# Patient Record
Sex: Female | Born: 1970
Health system: Southern US, Community
[De-identification: ages and names within clinical notes are randomized; demographics above are authoritative.]

## PROBLEM LIST (undated history)

## (undated) DIAGNOSIS — K219 Gastro-esophageal reflux disease without esophagitis: Secondary | ICD-10-CM

## (undated) DIAGNOSIS — A5901 Trichomonal vulvovaginitis: Secondary | ICD-10-CM

## (undated) DIAGNOSIS — E785 Hyperlipidemia, unspecified: Secondary | ICD-10-CM

## (undated) DIAGNOSIS — F32A Depression, unspecified: Secondary | ICD-10-CM

## (undated) DIAGNOSIS — G43909 Migraine, unspecified, not intractable, without status migrainosus: Secondary | ICD-10-CM

## (undated) DIAGNOSIS — F329 Major depressive disorder, single episode, unspecified: Secondary | ICD-10-CM

## (undated) DIAGNOSIS — M1990 Unspecified osteoarthritis, unspecified site: Secondary | ICD-10-CM

## (undated) DIAGNOSIS — F419 Anxiety disorder, unspecified: Secondary | ICD-10-CM

## (undated) HISTORY — DX: Migraine, unspecified, not intractable, without status migrainosus: G43.909

## (undated) HISTORY — DX: Hyperlipidemia, unspecified: E78.5

## (undated) HISTORY — DX: Trichomonal vulvovaginitis: A59.01

## (undated) HISTORY — DX: Depression, unspecified: F32.A

## (undated) HISTORY — PX: CLEFT PALATE REPAIR: SUR1165

## (undated) HISTORY — DX: Major depressive disorder, single episode, unspecified: F32.9

## (undated) HISTORY — PX: TUBAL LIGATION: SHX77

## (undated) HISTORY — PX: TYMPANIC MEMBRANE REPAIR: SHX294

## (undated) HISTORY — DX: Anxiety disorder, unspecified: F41.9

---

## 2002-10-06 ENCOUNTER — Encounter: Payer: Self-pay | Admitting: Orthopedic Surgery

## 2002-10-06 ENCOUNTER — Encounter: Admission: RE | Admit: 2002-10-06 | Discharge: 2002-10-06 | Payer: Self-pay | Admitting: Orthopedic Surgery

## 2002-12-08 ENCOUNTER — Encounter: Payer: Self-pay | Admitting: Orthopedic Surgery

## 2002-12-08 ENCOUNTER — Ambulatory Visit (HOSPITAL_COMMUNITY): Admission: RE | Admit: 2002-12-08 | Discharge: 2002-12-08 | Payer: Self-pay | Admitting: Orthopedic Surgery

## 2003-02-14 ENCOUNTER — Encounter: Admission: RE | Admit: 2003-02-14 | Discharge: 2003-04-13 | Payer: Self-pay | Admitting: Orthopedic Surgery

## 2005-04-12 ENCOUNTER — Encounter: Admission: RE | Admit: 2005-04-12 | Discharge: 2005-04-12 | Payer: Self-pay | Admitting: Family Medicine

## 2006-09-29 ENCOUNTER — Encounter: Admission: RE | Admit: 2006-09-29 | Discharge: 2006-09-29 | Payer: Self-pay | Admitting: Family Medicine

## 2010-04-08 ENCOUNTER — Encounter: Payer: Self-pay | Admitting: Family Medicine

## 2010-07-12 ENCOUNTER — Other Ambulatory Visit (HOSPITAL_COMMUNITY): Payer: Self-pay | Admitting: Family Medicine

## 2010-07-12 DIAGNOSIS — Z139 Encounter for screening, unspecified: Secondary | ICD-10-CM

## 2010-07-19 ENCOUNTER — Ambulatory Visit (HOSPITAL_COMMUNITY): Payer: Self-pay

## 2010-07-26 ENCOUNTER — Ambulatory Visit (HOSPITAL_COMMUNITY)
Admission: RE | Admit: 2010-07-26 | Discharge: 2010-07-26 | Disposition: A | Discharge status: Home / Self Care | Payer: Medicaid Other | Source: Ambulatory Visit | Attending: Family Medicine | Admitting: Family Medicine

## 2010-07-26 DIAGNOSIS — Z1231 Encounter for screening mammogram for malignant neoplasm of breast: Secondary | ICD-10-CM | POA: Insufficient documentation

## 2010-07-26 DIAGNOSIS — Z139 Encounter for screening, unspecified: Secondary | ICD-10-CM

## 2010-08-03 NOTE — Op Note (Signed)
NAME:  Linda, Pugh                          ACCOUNT NO.:  [id number]   MEDICAL RECORD NO.:  [id number]                   PATIENT TYPE:  OIB   LOCATION:  2866                                 FACILITY:  [facility]   PHYSICIAN:  Ollen Gross, M.D.                 DATE OF BIRTH:  11-14-1970   DATE OF PROCEDURE:  12/08/2002  DATE OF DISCHARGE:                                 OPERATIVE REPORT   PREOPERATIVE DIAGNOSIS:  Right hip labral tear and chondral defect.   POSTOPERATIVE DIAGNOSIS:  Right hip labral tear and chondral defect.   OPERATION PERFORMED:  Right hip arthroscopy with debridement.   SURGEON:  Ollen Gross, M.D.   ASSISTANT:  Alexzandrew L. Julien Girt, P.A.   ANESTHESIA:  General.   ESTIMATED BLOOD LOSS:  Minimal.   COMPLICATIONS:  None.   CONDITION ON DISCHARGE:  Stable to recovery.   INDICATIONS FOR PROCEDURE:  Linda Pugh is a 40 year old female who had an on-the-  job injury about nine months ago when she developed significant immediate  hip pain.  The pain has gotten progressively worse over time.  Exam and  history are consistent with labral tear.  She does have arthroscopy and  debridement.   DESCRIPTION OF PROCEDURE:  After successful administration of general  anesthetic, the patient was placed in the left lateral decubitus position  with the right side up and the lateral perineal post was placed.  The post  was well padded.  Her right lower extremity was draped over the post and the  foot well-padded and placed in a traction boot. Under fluoroscopic guidance,  we then applied traction across the hip joint until it was adequately  distracted.  The hip was then prepped and draped in the usual sterile  fashion.  The standard anterior and posterior peritrochanteric portal sites  were marked and a spinal needle was passed and shown to enter the joint  fluoroscopically.  The fluoroscopy was then removed.  I injected 30mL of  saline into the joint through the posterior  spinal needle and it came out  through the anterior needle confirming they were both intra-articular.  We  then passed the nitinol wires and removed the needles.  The posterior  incision was made and the camera passed through the joint.  The arthroscopic  visualization proceeds.   The fovea looks normal.  The posterior and superior quadrants of the  acetabulum looked normal.  There was no evidence of labral tear or chondral  defect.  Anteriorly, she has an anterior-inferior labral tear as well as an  area that appears to be chondral blistering consistent with a probable  chondral delamination.  The femoral head looks normal throughout.  I then  made the incision for the anterior portal, dilated and passed a cannula and  probe.  She indeed had chondral delamination anterior superiorly.  There was  also an  anterior inferior labral  tear.  I debrided the labral tear with the  shaver and ArthroCare device.  The chondral delamination is about 1 cm x 5  mm.  I debrided that down to a stable bony base with the shaver with stable  cartilaginous edges.  We probed it and everything was found to be stable.  The labrum was not detached at all.  I again inspected the rest of the joint  and everything looked fine.  We then removed the arthroscopic equipment from  the anterior portal and injected 20mL of 0.5% Marcaine with epinephrine  through the posterior portal, removed the cannula and let traction off the  hip.  I then took a fluoroscopic spot and showed that the hip was  concentrically reduced.  We then closed the incisions with interrupted 4-0  nylon and put a bulky sterile dressing on.  She was then awakened and  transported to recovery in stable condition.                                                Ollen Gross, M.D.    FA/MEDQ  D:  12/08/2002  T:  12/09/2002  Job:  [id number]

## 2011-01-31 ENCOUNTER — Other Ambulatory Visit: Payer: Self-pay | Admitting: Orthopedic Surgery

## 2011-03-05 ENCOUNTER — Encounter (HOSPITAL_COMMUNITY): Payer: Self-pay | Admitting: Pharmacy Technician

## 2011-03-18 ENCOUNTER — Other Ambulatory Visit: Payer: Self-pay

## 2011-03-18 ENCOUNTER — Encounter (HOSPITAL_COMMUNITY)
Admission: RE | Admit: 2011-03-18 | Discharge: 2011-03-18 | Disposition: A | Discharge status: Home / Self Care | Payer: Medicaid Other | Source: Ambulatory Visit | Attending: Orthopedic Surgery | Admitting: Orthopedic Surgery

## 2011-03-18 ENCOUNTER — Encounter (HOSPITAL_COMMUNITY): Payer: Self-pay

## 2011-03-18 ENCOUNTER — Ambulatory Visit (HOSPITAL_COMMUNITY)
Admission: RE | Admit: 2011-03-18 | Discharge: 2011-03-18 | Disposition: A | Discharge status: Home / Self Care | Payer: Medicaid Other | Source: Ambulatory Visit | Attending: Orthopedic Surgery | Admitting: Orthopedic Surgery

## 2011-03-18 DIAGNOSIS — Z01812 Encounter for preprocedural laboratory examination: Secondary | ICD-10-CM | POA: Insufficient documentation

## 2011-03-18 DIAGNOSIS — Z01818 Encounter for other preprocedural examination: Secondary | ICD-10-CM | POA: Insufficient documentation

## 2011-03-18 HISTORY — DX: Unspecified osteoarthritis, unspecified site: M19.90

## 2011-03-18 HISTORY — DX: Gastro-esophageal reflux disease without esophagitis: K21.9

## 2011-03-18 LAB — CBC
HCT: 44.7 % (ref 36.0–46.0)
MCH: 32.4 pg (ref 26.0–34.0)
MCHC: 34.2 g/dL (ref 30.0–36.0)
MCV: 94.7 fL (ref 78.0–100.0)
RDW: 12.2 % (ref 11.5–15.5)

## 2011-03-18 LAB — BASIC METABOLIC PANEL
BUN: 12 mg/dL (ref 6–23)
CO2: 27 mEq/L (ref 19–32)
Chloride: 105 mEq/L (ref 96–112)
Creatinine, Ser: 0.78 mg/dL (ref 0.50–1.10)
Glucose, Bld: 79 mg/dL (ref 70–99)

## 2011-03-18 NOTE — Patient Instructions (Signed)
20 Linda Pugh  03/18/2011   Your procedure is scheduled on:  03/20/11 400pm-530 pm   Report to [facility] at 100 pm per Dr Lequita Halt   Call this number if you have problems the morning of surgery: [phone]   Remember:   Do not eat food:After Midnight.  May have clear liquids:until 0900 am then npo .  Clear liquids include soda, tea, black coffee, apple or grape juice, broth.  Take these medicines the morning of surgery with A SIP OF WATER:    Do not wear jewelry, make-up or nail polish.   Do not wear lotions, powders, or perfumes.   Do not shave 48 hours prior to surgery.  Do not bring valuables to the hospital.  Contacts, dentures or bridgework may not be worn into surgery.     Patients discharged the day of surgery will not be allowed to drive home.  Name and phone number of your driver:   Special Instructions: CHG Shower Use Special Wash: 1/2 bottle night before surgery and 1/2 bottle morning of surgery. shower chin to toes with CHG. Wash face and private parts with regular soap.    Please read over the following fact sheets that you were given: MRSA Information, incentive spirometry , coughing and deep breathing exercises and leg exercises

## 2011-03-19 ENCOUNTER — Encounter (HOSPITAL_COMMUNITY): Payer: Self-pay | Admitting: Orthopedic Surgery

## 2011-03-19 NOTE — H&P (Signed)
  CC- Linda Pugh is a 41 y.o. female who presents with right hip pain  Hip Pain: Patient complains of right hip pain. Onset of the symptoms was several months ago. Inciting event: none. Current symptoms include pain which  is worse with weight bearing, is aggravated by walking and any hip rotation. Associated symptoms, low back pain. Aggravating symptoms: any weight bearing, pivoting, rising after sitting and squatting. Patient's course of pain: gradually worsening. Patient has had prior hip problems. Previous visits for this problem: had successful hip arthroscopy several years ago. Current symptoms are similar.. Evaluation to date: plain films, which were normal and MRI lumbar was normal.  Treatment to date: intra-articular injection provided mild short term relief..  Past Medical History  Diagnosis Date  . GERD (gastroesophageal reflux disease)   . Arthritis     arthritis right hip     Past Surgical History  Procedure Date  . Tubal ligation   . Other surgical history     cleftg palate surgery   . Other surgical history     ear surgery   . Other surgical history     right hip arthroscopy     Prior to Admission medications   Medication Sig Start Date End Date Taking? Authorizing Provider  naproxen sodium (ANAPROX) 220 MG tablet Take 440 mg by mouth 2 (two) times daily as needed. Pain     Historical Provider, MD    Physical Examination: General appearance - alert, well appearing, and in no distress Mental status - alert, oriented to person, place, and time Chest - clear to auscultation, no wheezes, rales or rhonchi, symmetric air entry Heart - normal rate, regular rhythm, normal S1, S2, no murmurs, rubs, clicks or gallops Abdomen - soft, nontender, nondistended, no masses or organomegaly Neurological - alert, oriented, normal speech, no focal findings or movement disorder noted  A right hip exam was performed. TENDERNESS: mild ROM: normal STRENGTH: normal Pain with  provacative testing  ASSESSMENT: Right hip pain- Exam and history consistent with recurrent labral tear and possible chondral defect. Presents for hip arthroscopy and debridement. Discussed in detail with patient who elects to proceed. Goals are decreased pain and improved function with high likelihood of achieving both.  Plan  Linda Pugh. Willo Yoon, MD    03/19/2011, 1:54 PM

## 2011-03-20 ENCOUNTER — Encounter (HOSPITAL_COMMUNITY): Payer: Self-pay | Admitting: *Deleted

## 2011-03-20 ENCOUNTER — Encounter (HOSPITAL_COMMUNITY): Payer: Self-pay | Admitting: Anesthesiology

## 2011-03-20 ENCOUNTER — Ambulatory Visit (HOSPITAL_COMMUNITY): Payer: Medicaid Other | Admitting: Anesthesiology

## 2011-03-20 ENCOUNTER — Ambulatory Visit (HOSPITAL_COMMUNITY)
Admission: RE | Admit: 2011-03-20 | Discharge: 2011-03-20 | Disposition: A | Discharge status: Home / Self Care | Payer: Medicaid Other | Source: Ambulatory Visit | Attending: Orthopedic Surgery | Admitting: Orthopedic Surgery

## 2011-03-20 ENCOUNTER — Encounter (HOSPITAL_COMMUNITY): Payer: Self-pay | Admitting: Orthopedic Surgery

## 2011-03-20 ENCOUNTER — Ambulatory Visit (HOSPITAL_COMMUNITY): Payer: Medicaid Other

## 2011-03-20 ENCOUNTER — Encounter (HOSPITAL_COMMUNITY)
Admission: RE | Disposition: A | Discharge status: Home / Self Care | Payer: Self-pay | Source: Ambulatory Visit | Attending: Orthopedic Surgery

## 2011-03-20 DIAGNOSIS — Z01810 Encounter for preprocedural cardiovascular examination: Secondary | ICD-10-CM | POA: Insufficient documentation

## 2011-03-20 DIAGNOSIS — K219 Gastro-esophageal reflux disease without esophagitis: Secondary | ICD-10-CM | POA: Insufficient documentation

## 2011-03-20 DIAGNOSIS — M24159 Other articular cartilage disorders, unspecified hip: Secondary | ICD-10-CM | POA: Insufficient documentation

## 2011-03-20 DIAGNOSIS — M1610 Unilateral primary osteoarthritis, unspecified hip: Secondary | ICD-10-CM | POA: Insufficient documentation

## 2011-03-20 DIAGNOSIS — M25551 Pain in right hip: Secondary | ICD-10-CM

## 2011-03-20 HISTORY — PX: HIP ARTHROSCOPY: SHX668

## 2011-03-20 SURGERY — ARTHROSCOPY HIP
Anesthesia: General | Site: Hip | Laterality: Right | Wound class: Clean

## 2011-03-20 MED ORDER — CHLORHEXIDINE GLUCONATE 4 % EX LIQD: 60.0000 mL | Freq: Once | CUTANEOUS | Status: DC

## 2011-03-20 MED ORDER — ACETAMINOPHEN 10 MG/ML IV SOLN
1000.0000 mg | Freq: Once | INTRAVENOUS | Status: DC
  Filled 2011-03-20: qty 100

## 2011-03-20 MED ORDER — OXYCODONE HCL 5 MG PO TABS
ORAL_TABLET | ORAL | Status: AC
  Administered 2011-03-20: 5 mg via ORAL
  Filled 2011-03-20: qty 1

## 2011-03-20 MED ORDER — LACTATED RINGERS IV SOLN
INTRAVENOUS | Status: DC
  Administered 2011-03-20 (×2): via INTRAVENOUS
  Administered 2011-03-20: 1000 mL via INTRAVENOUS

## 2011-03-20 MED ORDER — DEXAMETHASONE SODIUM PHOSPHATE 4 MG/ML IJ SOLN
INTRAMUSCULAR | Status: DC | PRN
  Administered 2011-03-20: 5 mg via INTRAVENOUS

## 2011-03-20 MED ORDER — OXYCODONE HCL 5 MG PO TABS: 5.0000 mg | ORAL_TABLET | ORAL | Status: AC | PRN

## 2011-03-20 MED ORDER — KETOROLAC TROMETHAMINE 30 MG/ML IJ SOLN
INTRAMUSCULAR | Status: DC | PRN
  Administered 2011-03-20: 30 mg via INTRAVENOUS

## 2011-03-20 MED ORDER — ACETAMINOPHEN 10 MG/ML IV SOLN
INTRAVENOUS | Status: DC | PRN
  Administered 2011-03-20: 1000 mg via INTRAVENOUS

## 2011-03-20 MED ORDER — SODIUM CHLORIDE 0.9 % IJ SOLN
INTRAMUSCULAR | Status: DC | PRN
  Administered 2011-03-20: 50 mL

## 2011-03-20 MED ORDER — GLYCOPYRROLATE 0.2 MG/ML IJ SOLN
INTRAMUSCULAR | Status: DC | PRN
  Administered 2011-03-20: .4 mg via INTRAVENOUS

## 2011-03-20 MED ORDER — PROPOFOL 10 MG/ML IV EMUL
INTRAVENOUS | Status: DC | PRN
  Administered 2011-03-20: 100 mg via INTRAVENOUS

## 2011-03-20 MED ORDER — OXYCODONE HCL 5 MG PO TABS
5.0000 mg | ORAL_TABLET | ORAL | Status: DC | PRN
  Administered 2011-03-20: 5 mg via ORAL

## 2011-03-20 MED ORDER — LACTATED RINGERS IV SOLN: INTRAVENOUS | Status: DC

## 2011-03-20 MED ORDER — SODIUM CHLORIDE 0.9 % IV SOLN: INTRAVENOUS | Status: DC

## 2011-03-20 MED ORDER — NEOSTIGMINE METHYLSULFATE 1 MG/ML IJ SOLN
INTRAMUSCULAR | Status: DC | PRN
  Administered 2011-03-20: 2 mg via INTRAVENOUS

## 2011-03-20 MED ORDER — FENTANYL CITRATE 0.05 MG/ML IJ SOLN
INTRAMUSCULAR | Status: DC | PRN
  Administered 2011-03-20: 100 ug via INTRAVENOUS

## 2011-03-20 MED ORDER — CEFAZOLIN SODIUM 1-5 GM-% IV SOLN
1.0000 g | INTRAVENOUS | Status: AC
  Administered 2011-03-20: 1 g via INTRAVENOUS
  Filled 2011-03-20: qty 50

## 2011-03-20 MED ORDER — CISATRACURIUM BESYLATE 2 MG/ML IV SOLN
INTRAVENOUS | Status: DC | PRN
  Administered 2011-03-20: 2 mg via INTRAVENOUS

## 2011-03-20 MED ORDER — PROMETHAZINE HCL 25 MG/ML IJ SOLN: 6.2500 mg | INTRAMUSCULAR | Status: DC | PRN

## 2011-03-20 MED ORDER — ONDANSETRON HCL 4 MG/2ML IJ SOLN
INTRAMUSCULAR | Status: DC | PRN
  Administered 2011-03-20: 4 mg via INTRAVENOUS

## 2011-03-20 MED ORDER — LIDOCAINE HCL (CARDIAC) 20 MG/ML IV SOLN
INTRAVENOUS | Status: DC | PRN
  Administered 2011-03-20: 50 mg via INTRAVENOUS

## 2011-03-20 MED ORDER — BUPIVACAINE-EPINEPHRINE 0.25% -1:200000 IJ SOLN
INTRAMUSCULAR | Status: DC | PRN
  Administered 2011-03-20: 20 mL

## 2011-03-20 MED ORDER — MIDAZOLAM HCL 5 MG/5ML IJ SOLN
INTRAMUSCULAR | Status: DC | PRN
  Administered 2011-03-20: 1 mg via INTRAVENOUS

## 2011-03-20 MED ORDER — BUPIVACAINE-EPINEPHRINE PF 0.25-1:200000 % IJ SOLN
INTRAMUSCULAR | Status: AC
  Filled 2011-03-20: qty 30

## 2011-03-20 MED ORDER — FENTANYL CITRATE 0.05 MG/ML IJ SOLN
25.0000 ug | INTRAMUSCULAR | Status: DC | PRN
  Administered 2011-03-20: 25 ug via INTRAVENOUS

## 2011-03-20 MED ORDER — METHOCARBAMOL 500 MG PO TABS: 500.0000 mg | ORAL_TABLET | Freq: Four times a day (QID) | ORAL | Status: AC

## 2011-03-20 MED ORDER — BUPIVACAINE HCL (PF) 0.25 % IJ SOLN
INTRAMUSCULAR | Status: AC
  Filled 2011-03-20: qty 30

## 2011-03-20 MED ORDER — DEXAMETHASONE SODIUM PHOSPHATE 10 MG/ML IJ SOLN
10.0000 mg | Freq: Once | INTRAMUSCULAR | Status: DC
  Filled 2011-03-20: qty 1

## 2011-03-20 MED ORDER — LACTATED RINGERS IR SOLN
Status: DC | PRN
  Administered 2011-03-20: 3000 mL

## 2011-03-20 MED ORDER — FENTANYL CITRATE 0.05 MG/ML IJ SOLN
INTRAMUSCULAR | Status: AC
  Filled 2011-03-20: qty 2

## 2011-03-20 MED ORDER — SUCCINYLCHOLINE CHLORIDE 20 MG/ML IJ SOLN
INTRAMUSCULAR | Status: DC | PRN
  Administered 2011-03-20: 40 mg via INTRAVENOUS

## 2011-03-20 SURGICAL SUPPLY — 34 items
BAG DECANTER FOR FLEXI CONT (MISCELLANEOUS) ×1 IMPLANT
BLADE DA 4.2 (BLADE) ×2 IMPLANT
CLOTH BEACON ORANGE TIMEOUT ST (SAFETY) ×2 IMPLANT
COVER MAYO STAND STRL (DRAPES) ×2 IMPLANT
DRAPE LG THREE QUARTER DISP (DRAPES) ×2 IMPLANT
DRAPE STERI 35X30 U-POUCH (DRAPES) ×4 IMPLANT
DRAPE STERI IOBAN 125X83 (DRAPES) ×2 IMPLANT
DRSG EMULSION OIL 3X3 NADH (GAUZE/BANDAGES/DRESSINGS) ×2 IMPLANT
DRSG MEPILEX BORDER 4X4 (GAUZE/BANDAGES/DRESSINGS) ×1 IMPLANT
DRSG PAD ABDOMINAL 8X10 ST (GAUZE/BANDAGES/DRESSINGS) ×3 IMPLANT
DURAPREP 26ML APPLICATOR (WOUND CARE) ×2 IMPLANT
GLOVE BIO SURGEON STRL SZ7.5 (GLOVE) ×2 IMPLANT
GLOVE BIO SURGEON STRL SZ8 (GLOVE) ×2 IMPLANT
GLOVE BIOGEL PI IND STRL 8 (GLOVE) ×2 IMPLANT
GLOVE BIOGEL PI INDICATOR 8 (GLOVE) ×2
GOWN STRL NON-REIN LRG LVL3 (GOWN DISPOSABLE) ×2 IMPLANT
GOWN STRL REIN XL XLG (GOWN DISPOSABLE) ×2 IMPLANT
KIT HIP ARTHROSCOPY (SET/KITS/TRAYS/PACK) ×2 IMPLANT
MANIFOLD NEPTUNE II (INSTRUMENTS) ×4 IMPLANT
NEEDLE HYPO 22GX1.5 SAFETY (NEEDLE) ×1 IMPLANT
NS IRRIG 1000ML POUR BTL (IV SOLUTION) ×2 IMPLANT
PACK ARTHROSCOPY WL (CUSTOM PROCEDURE TRAY) ×2 IMPLANT
PAD CAST 4YDX4 CTTN HI CHSV (CAST SUPPLIES) ×1 IMPLANT
PADDING CAST COTTON 4X4 STRL (CAST SUPPLIES) ×2
POSITIONER SURGICAL ARM (MISCELLANEOUS) ×3 IMPLANT
SET ARTHROSCOPY TUBING (MISCELLANEOUS) ×2
SET ARTHROSCOPY TUBING LN (MISCELLANEOUS) ×1 IMPLANT
SPONGE GAUZE 4X4 12PLY (GAUZE/BANDAGES/DRESSINGS) ×2 IMPLANT
SUT ETHILON 4 0 PS 2 18 (SUTURE) ×2 IMPLANT
SYR 20CC LL (SYRINGE) ×1 IMPLANT
TAPE CLOTH 4X10 WHT NS (GAUZE/BANDAGES/DRESSINGS) ×2 IMPLANT
TOWEL OR 17X26 10 PK STRL BLUE (TOWEL DISPOSABLE) ×4 IMPLANT
WAND TURBOVAC 50 DEGREE (SURGICAL WAND) ×1 IMPLANT
WATER STERILE IRR 1500ML POUR (IV SOLUTION) ×1 IMPLANT

## 2011-03-20 NOTE — Op Note (Signed)
PREOPERATIVE DIAGNOSIS:   Right hip labral tear.   POSTOPERATIVE DIAGNOSIS:  1. Right hip labral tear.  2. Chondral defect.   PROCEDURE: Right hip arthroscopy with labral debridement and chondroplasty  SURGEON: Ollen Gross, M.D.   ASSISTANT: Alexzandrew L. Perkins, P.A.C.   ANESTHESIA: General.   ESTIMATED BLOOD LOSS: Minimal.   DRAINS: None.   COMPLICATION: None.   CONDITION: Stable to recovery.   BRIEF CLINICAL INDICATION: Linda Pugh is a 41 y.o. female with a long  history of significant pain and dysfunction of her right hip. Exam and  history suggested labral tear, recurrent in nature. She has had  extensive nonoperative management, presents now for arthroscopy and  debridement.   PROCEDURE IN DETAIL: After successful administration of general  anesthetic, the patient was placed in the Left lateral decubitus  position with the Right  side up. Lower leg was well padded. Right  lower extremity was then placed over the well-padded peroneal post laterally  and then placed in the well-padded foot holder. Under fluoroscopic  guidance, traction was applied across Right lower extremity until the hip  was adequately distracted. Traction was locked in this position. The  thigh was then prepped and draped in usual sterile fashion. Spinal  needles were passed through the sites marked, anterior and posterior  peritrochanteric portals. A small incision was made around the  posterior needle. Through a cannulated system, the cannula was passed  into the joint. Camera passed into the joint. Once confirmed to be  intra-articular, inflow was initiated.      The femoral head shows a mild chondromalacia, no full-thickness defects. The anterior aspect of the joint from about the 1 o'clock to the 5 o'clock position shows  Grade III chondromalacia along the chondral-labral junction with some  exposed bone. There was a tear in the labrum going from the one o'clock  to the 4 o'clock  position. It was not detached. Tear was on the free  edge of the labrum. The anterior port was then accessed and labral  tear debrided back to a stable base with the shaver and the ArthroCare  device. The unstable cartilage on the surface of the acetabulum was  debrided back to a stable bony base with stable cartilaginous edges.  The exposed bone was abraded with the shaver for an abrasion  chondroplasty. The overall size of this was about 1 x 2 cm. We then  Inspected the rest of the joint and no other tears, defects, or loose bodies were noted.       The working portal equipment was removed and then 20 cc of 0.25% Marcaine  with epinephrine injected through the inflow cannula and that was  removed. Traction was removed from the joint and the incision was  closed with interrupted 4-0 nylon. Incisions were cleaned and dried.  Bulky sterile dressing applied. She was then awakened and transported  to recovery in stable condition.   Ollen Gross, M.D.

## 2011-03-20 NOTE — Anesthesia Procedure Notes (Addendum)
Anesthesia Regional Block:   Narrative:    Procedure Name: Intubation Date/Time: 03/20/2011 3:19 PM Performed by: Lurlean Leyden, Ceira Hoeschen L. Patient Re-evaluated:Patient Re-evaluated prior to inductionOxygen Delivery Method: Circle System Utilized Preoxygenation: Pre-oxygenation with 100% oxygen Intubation Type: IV induction Ventilation: Mask ventilation without difficulty and Oral airway inserted - appropriate to patient size Laryngoscope Size: Miller and 2 Grade View: Grade I Tube type: Oral Tube size: 7.0 mm Number of attempts: 1 Airway Equipment and Method: stylet Placement Confirmation: ETT inserted through vocal cords under direct vision,  positive ETCO2 and breath sounds checked- equal and bilateral Secured at: 20 cm Tube secured with: Tape Dental Injury: Teeth and Oropharynx as per pre-operative assessment

## 2011-03-20 NOTE — Transfer of Care (Signed)
Immediate Anesthesia Transfer of Care Note  Patient: Linda Pugh  Procedure(s) Performed:  ARTHROSCOPY HIP - hip arthtroscopy right with labral debridment and chondroplasty  Patient Location: PACU  Anesthesia Type: General  Level of Consciousness: alert  and oriented  Airway & Oxygen Therapy: Patient Spontanous Breathing and Patient connected to face mask oxygen  Post-op Assessment: Report given to PACU RN and Post -op Vital signs reviewed and stable  Post vital signs: Reviewed and stable  Complications: No apparent anesthesia complications

## 2011-03-20 NOTE — Interval H&P Note (Signed)
History and Physical Interval Note:  03/20/2011 3:06 PM  Linda Pugh  has presented today for surgery, with the diagnosis of Right Hip Labral Tear  The various methods of treatment have been discussed with the patient and family. After consideration of risks, benefits and other options for treatment, the patient has consented to  Procedure(s): ARTHROSCOPY HIP as a surgical intervention .  The patients' history has been reviewed, patient examined, no change in status, stable for surgery.  I have reviewed the patients' chart and labs.  Questions were answered to the patient's satisfaction.     Loanne Drilling

## 2011-03-20 NOTE — Preoperative (Signed)
Beta Blockers   Reason not to administer Beta Blockers:Not Applicable 

## 2011-03-20 NOTE — Anesthesia Postprocedure Evaluation (Signed)
  Anesthesia Post-op Note  Patient: Linda Pugh  Procedure(s) Performed:  ARTHROSCOPY HIP - hip arthtroscopy right with labral debridment and chondroplasty  Patient Location: PACU  Anesthesia Type: General  Level of Consciousness: awake and alert   Airway and Oxygen Therapy: Patient Spontanous Breathing  Post-op Pain: mild  Post-op Assessment: Post-op Vital signs reviewed, Patient's Cardiovascular Status Stable, Respiratory Function Stable, Patent Airway and No signs of Nausea or vomiting  Post-op Vital Signs: stable  Complications: No apparent anesthesia complications

## 2011-03-20 NOTE — Progress Notes (Signed)
Call To Dr Lequita Halt re: prn pain med in Postop and crutches

## 2011-03-20 NOTE — Anesthesia Preprocedure Evaluation (Addendum)
Anesthesia Evaluation  Patient identified by MRN, date of birth, ID band Patient awake    Reviewed: Allergy & Precautions, H&P , NPO status , Patient's Chart, lab work & pertinent test results  Airway Mallampati: II TM Distance: >3 FB Neck ROM: full    Dental No notable dental hx. (+) Teeth Intact and Dental Advisory Given   Pulmonary Current Smoker,  clear to auscultation  Pulmonary exam normal       Cardiovascular Exercise Tolerance: Good neg cardio ROS regular Normal    Neuro/Psych Negative Neurological ROS  Negative Psych ROS   GI/Hepatic negative GI ROS, Neg liver ROS, GERD-  Controlled,  Endo/Other  Negative Endocrine ROS  Renal/GU negative Renal ROS  Genitourinary negative   Musculoskeletal   Abdominal   Peds  Hematology negative hematology ROS (+)   Anesthesia Other Findings   Reproductive/Obstetrics negative OB ROS                         Anesthesia Physical Anesthesia Plan  ASA: II  Anesthesia Plan: General   Post-op Pain Management:    Induction: Intravenous  Airway Management Planned: Oral ETT  Additional Equipment:   Intra-op Plan:   Post-operative Plan: Extubation in OR  Informed Consent: I have reviewed the patients History and Physical, chart, labs and discussed the procedure including the risks, benefits and alternatives for the proposed anesthesia with the patient or authorized representative who has indicated his/her understanding and acceptance.   Dental Advisory Given  Plan Discussed with: CRNA and Surgeon  Anesthesia Plan Comments:        Anesthesia Quick Evaluation

## 2011-03-21 ENCOUNTER — Encounter (HOSPITAL_COMMUNITY): Payer: Self-pay | Admitting: Orthopedic Surgery

## 2011-05-20 ENCOUNTER — Encounter (HOSPITAL_COMMUNITY): Payer: Self-pay | Admitting: Emergency Medicine

## 2011-05-20 ENCOUNTER — Emergency Department (HOSPITAL_COMMUNITY)
Admission: EM | Admit: 2011-05-20 | Discharge: 2011-05-20 | Disposition: A | Discharge status: Home / Self Care | Payer: Medicaid Other | Attending: Emergency Medicine | Admitting: Emergency Medicine

## 2011-05-20 ENCOUNTER — Emergency Department (HOSPITAL_COMMUNITY): Payer: Medicaid Other

## 2011-05-20 DIAGNOSIS — K219 Gastro-esophageal reflux disease without esophagitis: Secondary | ICD-10-CM | POA: Insufficient documentation

## 2011-05-20 DIAGNOSIS — R071 Chest pain on breathing: Secondary | ICD-10-CM | POA: Insufficient documentation

## 2011-05-20 DIAGNOSIS — R0789 Other chest pain: Secondary | ICD-10-CM

## 2011-05-20 DIAGNOSIS — M1610 Unilateral primary osteoarthritis, unspecified hip: Secondary | ICD-10-CM | POA: Insufficient documentation

## 2011-05-20 DIAGNOSIS — F17200 Nicotine dependence, unspecified, uncomplicated: Secondary | ICD-10-CM | POA: Insufficient documentation

## 2011-05-20 MED ORDER — HYDROCODONE-ACETAMINOPHEN 5-325 MG PO TABS: 1.0000 | ORAL_TABLET | ORAL | Status: AC | PRN

## 2011-05-20 NOTE — ED Notes (Signed)
Pt c/o left sided chest pain since her son hugged her.

## 2011-05-20 NOTE — Discharge Instructions (Signed)
Chest Wall Pain Chest wall pain is pain in or around the bones and muscles of your chest. It may take up to 6 weeks to get better. It may take longer if you must stay physically active in your work and activities.  CAUSES  Chest wall pain may happen on its own. However, it may be caused by:  A viral illness like the flu.   Injury.   Coughing.   Exercise.   Arthritis.   Fibromyalgia.   Shingles.  HOME CARE INSTRUCTIONS   Avoid overtiring physical activity. Try not to strain or perform activities that cause pain. This includes any activities using your chest or your abdominal and side muscles, especially if heavy weights are used.   Put ice on the sore area.   Put ice in a plastic bag.   Place a towel between your skin and the bag.   Leave the ice on for 15 to 20 minutes per hour while awake for the first 2 days.   Only take over-the-counter or prescription medicines for pain, discomfort, or fever as directed by your caregiver.  SEEK IMMEDIATE MEDICAL CARE IF:   Your pain increases, or you are very uncomfortable.   You have a fever.   Your chest pain becomes worse.   You have new, unexplained symptoms.   You have nausea or vomiting.   You feel sweaty or lightheaded.   You have a cough with phlegm (sputum), or you cough up blood.  MAKE SURE YOU:   Understand these instructions.   Will watch your condition.   Will get help right away if you are not doing well or get worse.  Document Released: 03/04/2005 Document Revised: 02/21/2011 Document Reviewed: 10/29/2010 [facility] Patient Information 2012 [place], [place].   You may take the hydrocodone prescribed for pain relief.  This will make you drowsy - do not drive within 4 hours of taking this medication.

## 2011-05-20 NOTE — ED Provider Notes (Signed)
History     CSN: [id number]  Arrival date & time 05/20/11  1704   First MD Initiated Contact with Patient 05/20/11 1731      Chief Complaint  Patient presents with  . rib pain     (Consider location/radiation/quality/duration/timing/severity/associated sxs/prior treatment) HPI Comments: Patient presents for evaluation of the left chest wall pain which started immediately after she was "bearhugged" by her teenage son, 3 days ago.  Pain is worse with deep inspiration movement and palpation.  Patient is a 41 y.o. female presenting with chest pain. The history is provided by the patient.  Chest Pain The chest pain began 3 - 5 days ago. Chest pain occurs constantly. The chest pain is unchanged. The pain is associated with breathing, exertion and lifting. At its most intense, the pain is at 9/10. The pain is currently at 9/10. The quality of the pain is described as sharp. The pain does not radiate. Pertinent negatives for primary symptoms include no fever, no fatigue, no shortness of breath, no cough, no wheezing, no abdominal pain, no nausea, no vomiting and no dizziness.  Pertinent negatives for associated symptoms include no diaphoresis, no near-syncope, no numbness, no orthopnea, no paroxysmal nocturnal dyspnea and no weakness. She tried nothing for the symptoms. Risk factors include no known risk factors.     Past Medical History  Diagnosis Date  . GERD (gastroesophageal reflux disease)   . Arthritis     arthritis right hip     Past Surgical History  Procedure Date  . Tubal ligation   . Other surgical history     cleftg palate surgery   . Other surgical history     ear surgery   . Other surgical history     right hip arthroscopy   . Hip arthroscopy 03/20/2011    Procedure: ARTHROSCOPY HIP;  Surgeon: Gus Rankin Aluisio;  Location: [facility];  Service: Orthopedics;  Laterality: Right;  hip arthtroscopy right with labral debridment and chondroplasty    No family history on  file.  History  Substance Use Topics  . Smoking status: Current Everyday Smoker -- 1.0 packs/day for 23 years  . Smokeless tobacco: Never Used  . Alcohol Use: No    OB History    Grav Para Term Preterm Abortions TAB SAB Ect Mult Living                  Review of Systems  Constitutional: Negative for fever, diaphoresis and fatigue.  HENT: Negative for congestion, sore throat and neck pain.   Eyes: Negative.   Respiratory: Negative for cough, chest tightness, shortness of breath and wheezing.   Cardiovascular: Positive for chest pain. Negative for orthopnea and near-syncope.  Gastrointestinal: Negative for nausea, vomiting and abdominal pain.  Genitourinary: Negative.   Musculoskeletal: Negative for joint swelling and arthralgias.  Skin: Negative.  Negative for rash and wound.  Neurological: Negative for dizziness, weakness, light-headedness, numbness and headaches.  Hematological: Negative.   Psychiatric/Behavioral: Negative.     Allergies  Sulfa antibiotics  Home Medications   Current Outpatient Rx  Name Route Sig Dispense Refill  . HYDROCODONE-ACETAMINOPHEN 5-325 MG PO TABS Oral Take 1 tablet by mouth every 4 (four) hours as needed for pain. 15 tablet 0  . NAPROXEN SODIUM 220 MG PO TABS Oral Take 440 mg by mouth 2 (two) times daily as needed. Pain       BP 98/48  Pulse 91  Temp(Src) 97.7 F (36.5 C) (Oral)  Resp 18  Ht  5\' 2"  (1.575 m)  Wt 89 lb (40.37 kg)  BMI 16.28 kg/m2  SpO2 100%  LMP 05/13/2011  Physical Exam  Nursing note and vitals reviewed. Constitutional: She is oriented to person, place, and time. She appears well-developed and well-nourished.  HENT:  Head: Normocephalic and atraumatic.  Eyes: Conjunctivae are normal.  Neck: Normal range of motion.  Cardiovascular: Normal rate, regular rhythm, normal heart sounds and intact distal pulses.   Pulmonary/Chest: Effort normal and breath sounds normal. No respiratory distress. She has no wheezes. She  exhibits tenderness.    Abdominal: Soft. Bowel sounds are normal. There is no tenderness. There is no rebound and no guarding.  Musculoskeletal: Normal range of motion.  Neurological: She is alert and oriented to person, place, and time.  Skin: Skin is warm and dry.  Psychiatric: She has a normal mood and affect.    ED Course  Procedures (including critical care time)  Labs Reviewed - No data to display Dg Ribs Unilateral W/chest Left  05/20/2011  *RADIOLOGY REPORT*  Clinical Data: Left anterior rib pain.  LEFT RIBS AND CHEST - 3+ VIEW  Comparison: Chest x-ray from 03/18/2011.  Findings: The lungs are clear without focal infiltrate, edema, pneumothorax or pleural effusion. The cardiopericardial silhouette is within normal limits for size.  Oblique views of the left ribs showed no evidence for displaced left-sided rib fracture.  IMPRESSION: Normal chest x-ray.  No evidence for rib fracture.  Original Report Authenticated By: ERIC A. MANSELL, M.D.     1. Chest wall pain       MDM  Vital signs reviewed with blood pressure 98/48, prior blood pressures in patient's chart 96/45 and 110/61 in the past several months.  Patient is without orthostatic symptoms.        Candis Musa, PA 05/20/11 1908

## 2011-05-20 NOTE — ED Notes (Signed)
Pain lt  Lat ribs.  Injured  On Friday when hugged by son , and heard cracking sound.  Alert, NAD.

## 2011-05-21 NOTE — ED Provider Notes (Signed)
Medical screening examination/treatment/procedure(s) were performed by non-physician practitioner and as supervising physician I was immediately available for consultation/collaboration.   Geoffery Lyons, MD 05/21/11 [phone]

## 2012-10-26 ENCOUNTER — Emergency Department (HOSPITAL_COMMUNITY)
Admission: EM | Admit: 2012-10-26 | Discharge: 2012-10-26 | Disposition: A | Discharge status: Home / Self Care | Payer: Self-pay | Attending: Emergency Medicine | Admitting: Emergency Medicine

## 2012-10-26 ENCOUNTER — Emergency Department (HOSPITAL_COMMUNITY): Payer: Self-pay

## 2012-10-26 ENCOUNTER — Encounter (HOSPITAL_COMMUNITY): Payer: Self-pay | Admitting: *Deleted

## 2012-10-26 DIAGNOSIS — R110 Nausea: Secondary | ICD-10-CM | POA: Insufficient documentation

## 2012-10-26 DIAGNOSIS — N390 Urinary tract infection, site not specified: Secondary | ICD-10-CM | POA: Insufficient documentation

## 2012-10-26 DIAGNOSIS — M1610 Unilateral primary osteoarthritis, unspecified hip: Secondary | ICD-10-CM | POA: Insufficient documentation

## 2012-10-26 DIAGNOSIS — Z3202 Encounter for pregnancy test, result negative: Secondary | ICD-10-CM | POA: Insufficient documentation

## 2012-10-26 DIAGNOSIS — B9689 Other specified bacterial agents as the cause of diseases classified elsewhere: Secondary | ICD-10-CM | POA: Insufficient documentation

## 2012-10-26 DIAGNOSIS — R300 Dysuria: Secondary | ICD-10-CM | POA: Insufficient documentation

## 2012-10-26 DIAGNOSIS — Z8719 Personal history of other diseases of the digestive system: Secondary | ICD-10-CM | POA: Insufficient documentation

## 2012-10-26 DIAGNOSIS — R3915 Urgency of urination: Secondary | ICD-10-CM | POA: Insufficient documentation

## 2012-10-26 DIAGNOSIS — Z9851 Tubal ligation status: Secondary | ICD-10-CM | POA: Insufficient documentation

## 2012-10-26 DIAGNOSIS — F17200 Nicotine dependence, unspecified, uncomplicated: Secondary | ICD-10-CM | POA: Insufficient documentation

## 2012-10-26 DIAGNOSIS — A499 Bacterial infection, unspecified: Secondary | ICD-10-CM | POA: Insufficient documentation

## 2012-10-26 LAB — WET PREP, GENITAL
Clue Cells Wet Prep HPF POC: NONE SEEN
Trich, Wet Prep: NONE SEEN
WBC, Wet Prep HPF POC: NONE SEEN

## 2012-10-26 LAB — URINALYSIS, ROUTINE W REFLEX MICROSCOPIC
Bilirubin Urine: NEGATIVE
Glucose, UA: NEGATIVE mg/dL
Ketones, ur: NEGATIVE mg/dL
pH: 5.5 (ref 5.0–8.0)

## 2012-10-26 LAB — URINE MICROSCOPIC-ADD ON

## 2012-10-26 MED ORDER — CEPHALEXIN 500 MG PO CAPS
500.0000 mg | ORAL_CAPSULE | Freq: Once | ORAL | Status: AC
  Administered 2012-10-26: 500 mg via ORAL
  Filled 2012-10-26: qty 1

## 2012-10-26 MED ORDER — PHENAZOPYRIDINE HCL 200 MG PO TABS: 200.0000 mg | ORAL_TABLET | Freq: Three times a day (TID) | ORAL | Status: DC

## 2012-10-26 MED ORDER — PHENAZOPYRIDINE HCL 100 MG PO TABS
100.0000 mg | ORAL_TABLET | Freq: Once | ORAL | Status: AC
  Administered 2012-10-26: 100 mg via ORAL
  Filled 2012-10-26: qty 1

## 2012-10-26 MED ORDER — CEPHALEXIN 500 MG PO CAPS: 500.0000 mg | ORAL_CAPSULE | Freq: Four times a day (QID) | ORAL | Status: DC

## 2012-10-26 NOTE — ED Notes (Signed)
Patient with no complaints at this time. Respirations even and unlabored. Skin warm/dry. Discharge instructions reviewed with patient at this time. Patient given opportunity to voice concerns/ask questions. Patient discharged at this time and left Emergency Department with steady gait.   

## 2012-10-26 NOTE — ED Notes (Signed)
Lower abd pain x 3 days with painful urination x 3 days.  States only had burning with urination x 1 day.  Denies n/v/d.

## 2012-10-26 NOTE — ED Provider Notes (Signed)
CSN: [id number]     Arrival date & time 10/26/12  1437 History     First MD Initiated Contact with Patient 10/26/12 1447     Chief Complaint  Patient presents with  . Abdominal Pain   (Consider location/radiation/quality/duration/timing/severity/associated sxs/prior Treatment) HPI Comments: Dysuria 4 days ago, urgency since then.  Patient is a 42 y.o. female presenting with abdominal pain. The history is provided by the patient.  Abdominal Pain Pain location:  Suprapubic Pain quality: sharp   Pain radiates to:  Does not radiate Pain severity:  Moderate Onset quality:  Sudden Duration:  3 days Timing:  Constant Progression:  Worsening Chronicity:  New Context: not alcohol use, not sick contacts and not trauma   Relieved by:  Nothing Worsened by:  Nothing tried Ineffective treatments:  None tried Associated symptoms: dysuria (once, 4 days ago) and nausea (occasional)   Associated symptoms: no chest pain, no chills, no fever, no shortness of breath and no vomiting   Dysuria:    Severity:  Mild   Onset quality:  Sudden   Progression:  Resolved   Chronicity:  New   Past Medical History  Diagnosis Date  . GERD (gastroesophageal reflux disease)   . Arthritis     arthritis right hip    Past Surgical History  Procedure Laterality Date  . Tubal ligation    . Other surgical history      cleftg palate surgery   . Other surgical history      ear surgery   . Other surgical history      right hip arthroscopy   . Hip arthroscopy  03/20/2011    Procedure: ARTHROSCOPY HIP;  Surgeon: Gus Rankin Aluisio;  Location: [facility];  Service: Orthopedics;  Laterality: Right;  hip arthtroscopy right with labral debridment and chondroplasty   No family history on file. History  Substance Use Topics  . Smoking status: Current Every Day Smoker -- 1.00 packs/day for 23 years    Types: Cigarettes  . Smokeless tobacco: Never Used  . Alcohol Use: No   OB History   Grav Para Term Preterm  Abortions TAB SAB Ect Mult Living                 Review of Systems  Constitutional: Negative for fever and chills.  Respiratory: Negative for shortness of breath.   Cardiovascular: Negative for chest pain.  Gastrointestinal: Positive for nausea (occasional) and abdominal pain. Negative for vomiting.  Genitourinary: Positive for dysuria (once, 4 days ago).  All other systems reviewed and are negative.    Allergies  Sulfa antibiotics  Home Medications   Current Outpatient Rx  Name  Route  Sig  Dispense  Refill  . ibuprofen (ADVIL,MOTRIN) 200 MG tablet   Oral   Take 400 mg by mouth daily as needed for pain.         . naproxen sodium (ANAPROX) 220 MG tablet   Oral   Take 440 mg by mouth 2 (two) times daily as needed. Pain          . cephALEXin (KEFLEX) 500 MG capsule   Oral   Take 1 capsule (500 mg total) by mouth 4 (four) times daily.   20 capsule   0   . phenazopyridine (PYRIDIUM) 200 MG tablet   Oral   Take 1 tablet (200 mg total) by mouth 3 (three) times daily.   6 tablet   0    BP 121/58  Pulse 67  Temp(Src) 98.2 F (  36.8 C) (Oral)  Resp 20  Ht 5\' 2"  (1.575 m)  Wt 90 lb (40.824 kg)  BMI 16.46 kg/m2  SpO2 97%  LMP 10/12/2012 Physical Exam  Nursing note and vitals reviewed. Constitutional: She is oriented to person, place, and time. She appears well-developed and well-nourished. No distress.  HENT:  Head: Normocephalic and atraumatic.  Eyes: EOM are normal. Pupils are equal, round, and reactive to light.  Neck: Normal range of motion. Neck supple.  Cardiovascular: Normal rate and regular rhythm.  Exam reveals no friction rub.   No murmur heard. Pulmonary/Chest: Effort normal and breath sounds normal. No respiratory distress. She has no wheezes. She has no rales.  Abdominal: Soft. She exhibits no distension. There is tenderness (suprapubic). There is no rebound.  Genitourinary: Cervix exhibits motion tenderness. Right adnexum displays tenderness.  Right adnexum displays no mass. Left adnexum displays tenderness. Left adnexum displays no mass.  Diffuse tenderness on exam  Musculoskeletal: Normal range of motion. She exhibits no edema.  Neurological: She is alert and oriented to person, place, and time.  Skin: She is not diaphoretic.    ED Course   Procedures (including critical care time)  Labs Reviewed  URINALYSIS, ROUTINE W REFLEX MICROSCOPIC - Abnormal; Notable for the following:    Hgb urine dipstick MODERATE (*)    Leukocytes, UA SMALL (*)    All other components within normal limits  URINE MICROSCOPIC-ADD ON - Abnormal; Notable for the following:    Squamous Epithelial / LPF FEW (*)    Bacteria, UA MANY (*)    All other components within normal limits  WET PREP, GENITAL  GC/CHLAMYDIA PROBE AMP  URINE CULTURE  PREGNANCY, URINE   No results found. 1. UTI (urinary tract infection)     MDM  58F with no prior medical hx presents with urinary urgency for past few days. Dysuria initially, then progressed to urgency. No fevers, vomiting, diarrhea. No hematuria. AFVSS here. Diffuse tenderness on pelvic exam. No concerns for STDs, states last sexual contact 6 years ago.  UA with 21-50 whites, lots of bacteria, small leukocytes. Will treat for UTI with Keflex. Stable for discharge. Wet prep negative.  I have reviewed all labs and imaging and considered them in my medical decision making.   Dagmar Hait, MD 10/26/12 1728

## 2012-10-26 NOTE — ED Notes (Signed)
C/O bilateral pelvic pain, to the point of doubling over.  Burning w/urination, no vaginal discharge.  Menses has become irregular lately w/"hot flashes".  Was told by her PMD on last Pap smear that her "uterus is twisted or something".  No reported recent weight loss.  Decreased appetite d/t eating causing abdominal pain.

## 2012-10-26 NOTE — Progress Notes (Signed)
ED/CM noted pt did not have health insurance, and/or PCP. Patient was given the ED  [facility] uninsured handout  with information for the clinics, food pantries, and the handout for insurance signup. Patient and son, who was with her in the room,  expressed appreciation for this.

## 2012-10-27 LAB — GC/CHLAMYDIA PROBE AMP: GC Probe RNA: NEGATIVE

## 2012-10-28 LAB — URINE CULTURE

## 2012-10-31 ENCOUNTER — Telehealth (HOSPITAL_COMMUNITY): Payer: Self-pay | Admitting: Emergency Medicine

## 2012-10-31 NOTE — ED Notes (Signed)
Post ED Visit - Positive Culture Follow-up  Culture report reviewed by antimicrobial stewardship pharmacist: []  Wes Dulaney, Pharm.D., BCPS [x]  Celedonio Miyamoto, Pharm.D., BCPS []  Georgina Pillion, Pharm.D., BCPS []  [place], [address].D., BCPS, AAHIVP []  Estella Husk, Pharm.D., BCPS, AAHIVP  Positive urine culture Treated with Keflex, organism sensitive to the same and no further patient follow-up is required at this time.  Kylie A Holland 10/31/2012, 10:27 AM

## 2017-12-04 ENCOUNTER — Encounter: Payer: Self-pay | Admitting: Family Medicine

## 2017-12-04 ENCOUNTER — Ambulatory Visit: Payer: BLUE CROSS/BLUE SHIELD | Admitting: Family Medicine

## 2017-12-04 ENCOUNTER — Other Ambulatory Visit: Payer: Self-pay

## 2017-12-04 VITALS — BP 100/68 | HR 84 | Temp 98.0°F | Ht 62.0 in | Wt 91.8 lb

## 2017-12-04 DIAGNOSIS — J0100 Acute maxillary sinusitis, unspecified: Secondary | ICD-10-CM | POA: Diagnosis not present

## 2017-12-04 DIAGNOSIS — F17210 Nicotine dependence, cigarettes, uncomplicated: Secondary | ICD-10-CM | POA: Insufficient documentation

## 2017-12-04 DIAGNOSIS — K2960 Other gastritis without bleeding: Secondary | ICD-10-CM | POA: Diagnosis not present

## 2017-12-04 DIAGNOSIS — T39395A Adverse effect of other nonsteroidal anti-inflammatory drugs [NSAID], initial encounter: Secondary | ICD-10-CM

## 2017-12-04 DIAGNOSIS — Z23 Encounter for immunization: Secondary | ICD-10-CM | POA: Diagnosis not present

## 2017-12-04 MED ORDER — OMEPRAZOLE 20 MG PO CPDR
20.0000 mg | DELAYED_RELEASE_CAPSULE | Freq: Every day | ORAL | 3 refills | Status: DC
Start: 2017-12-04 — End: 2018-03-27

## 2017-12-04 MED ORDER — AMOXICILLIN 875 MG PO TABS: 875.0000 mg | ORAL_TABLET | Freq: Two times a day (BID) | ORAL | 0 refills | Status: AC

## 2017-12-04 MED ORDER — FLUCONAZOLE 150 MG PO TABS: ORAL_TABLET | ORAL | 0 refills | Status: DC

## 2017-12-04 NOTE — Patient Instructions (Addendum)
Please return in 3 months for your annual complete physical; please come fasting.  Take the antibiotics for your sinus infection.  Please stop the alleve and zanatac. Start omeprazole daily to help heal your stomach.  Please quit smoking. You may use the mint or gum to help or even the patch.   It was a pleasure meeting you today! Thank you for choosing [place] to meet your healthcare needs! I truly look forward to working with you. If you have any questions or concerns, please send me a message via Mychart or call the office at [phone].   Steps to Quit Smoking Smoking tobacco can be harmful to your health and can affect almost every organ in your body. Smoking puts you, and those around you, at risk for developing many serious chronic diseases. Quitting smoking is difficult, but it is one of the best things that you can do for your health. It is never too late to quit. What are the benefits of quitting smoking? When you quit smoking, you lower your risk of developing serious diseases and conditions, such as:  Lung cancer or lung disease, such as COPD.  Heart disease.  Stroke.  Heart attack.  Infertility.  Osteoporosis and bone fractures.  Additionally, symptoms such as coughing, wheezing, and shortness of breath may get better when you quit. You may also find that you get sick less often because your body is stronger at fighting off colds and infections. If you are pregnant, quitting smoking can help to reduce your chances of having a baby of low birth weight. How do I get ready to quit? When you decide to quit smoking, create a plan to make sure that you are successful. Before you quit:  Pick a date to quit. Set a date within the next two weeks to give you time to prepare.  Write down the reasons why you are quitting. Keep this list in places where you will see it often, such as on your bathroom mirror or in your car or wallet.  Identify the people, places, things, and activities  that make you want to smoke (triggers) and avoid them. Make sure to take these actions: ? Throw away all cigarettes at home, at work, and in your car. ? Throw away smoking accessories, such as Scientist, research (medical). ? Clean your car and make sure to empty the ashtray. ? Clean your home, including curtains and carpets.  Tell your family, friends, and coworkers that you are quitting. Support from your loved ones can make quitting easier.  Talk with your health care provider about your options for quitting smoking.  Find out what treatment options are covered by your health insurance.  What strategies can I use to quit smoking? Talk with your healthcare provider about different strategies to quit smoking. Some strategies include:  Quitting smoking altogether instead of gradually lessening how much you smoke over a period of time. Research shows that quitting "cold [place]" is more successful than gradually quitting.  Attending in-person counseling to help you build problem-solving skills. You are more likely to have success in quitting if you attend several counseling sessions. Even short sessions of 10 minutes can be effective.  Finding resources and support systems that can help you to quit smoking and remain smoke-free after you quit. These resources are most helpful when you use them often. They can include: ? Online chats with a Social worker. ? Telephone quitlines. ? Careers information officer. ? Support groups or group counseling. ? Text messaging programs. ?  Mobile phone applications.  Taking medicines to help you quit smoking. (If you are pregnant or breastfeeding, talk with your health care provider first.) Some medicines contain nicotine and some do not. Both types of medicines help with cravings, but the medicines that include nicotine help to relieve withdrawal symptoms. Your health care provider may recommend: ? Nicotine patches, gum, or lozenges. ? Nicotine inhalers or  sprays. ? Non-nicotine medicine that is taken by mouth.  Talk with your health care provider about combining strategies, such as taking medicines while you are also receiving in-person counseling. Using these two strategies together makes you more likely to succeed in quitting than if you used either strategy on its own. If you are pregnant or breastfeeding, talk with your health care provider about finding counseling or other support strategies to quit smoking. Do not take medicine to help you quit smoking unless told to do so by your health care provider. What things can I do to make it easier to quit? Quitting smoking might feel overwhelming at first, but there is a lot that you can do to make it easier. Take these important actions:  Reach out to your family and friends and ask that they support and encourage you during this time. Call telephone quitlines, reach out to support groups, or work with a counselor for support.  Ask people who smoke to avoid smoking around you.  Avoid places that trigger you to smoke, such as bars, parties, or smoke-break areas at work.  Spend time around people who do not smoke.  Lessen stress in your life, because stress can be a smoking trigger for some people. To lessen stress, try: ? Exercising regularly. ? Deep-breathing exercises. ? Yoga. ? Meditating. ? Performing a body scan. This involves closing your eyes, scanning your body from head to toe, and noticing which parts of your body are particularly tense. Purposefully relax the muscles in those areas.  Download or purchase mobile phone or tablet apps (applications) that can help you stick to your quit plan by providing reminders, tips, and encouragement. There are many free apps, such as QuitGuide from the [employer] Office manager for Disease Control and Prevention). You can find other support for quitting smoking (smoking cessation) through smokefree.gov and other websites.  How will I feel when I quit  smoking? Within the first 24 hours of quitting smoking, you may start to feel some withdrawal symptoms. These symptoms are usually most noticeable 2-3 days after quitting, but they usually do not last beyond 2-3 weeks. Changes or symptoms that you might experience include:  Mood swings.  Restlessness, anxiety, or irritation.  Difficulty concentrating.  Dizziness.  Strong cravings for sugary foods in addition to nicotine.  Mild weight gain.  Constipation.  Nausea.  Coughing or a sore throat.  Changes in how your medicines work in your body.  A depressed mood.  Difficulty sleeping (insomnia).  After the first 2-3 weeks of quitting, you may start to notice more positive results, such as:  Improved sense of smell and taste.  Decreased coughing and sore throat.  Slower heart rate.  Lower blood pressure.  Clearer skin.  The ability to breathe more easily.  Fewer sick days.  Quitting smoking is very challenging for most people. Do not get discouraged if you are not successful the first time. Some people need to make many attempts to quit before they achieve long-term success. Do your best to stick to your quit plan, and talk with your health care provider if you  have any questions or concerns. This information is not intended to replace advice given to you by your health care provider. Make sure you discuss any questions you have with your health care provider. Document Released: 02/26/2001 Document Revised: 10/31/2015 Document Reviewed: 07/19/2014 Elsevier Interactive Patient Education  [employer].

## 2017-12-04 NOTE — Progress Notes (Signed)
Subjective  CC:  Chief Complaint  Patient presents with  . Establish Care    Last saw Linda Pugh, last Physical 5 years ago, requests flu shot   . Sinus Problem    Sinus Pain and Pressure x couple weeks     HPI: Linda Pugh is a 47 y.o. female who presents to [place] at [facility] today to establish care with me as a new patient.   She has the following concerns or needs:  47 year old female G2 P2 divorced who lives with her mother and her 2 sons.  Has been without healthcare insurance for the last 5 years.  No old medical records available for review.  Presents due to a 3-week history of stomach discomfort and sinus pain with headaches.  Reports started with nausea and upper abdominal pain without vomiting or diarrhea.  Symptoms have improved, however she still will get epigastric pain.  She is been taking Zantac twice a day which has been helpful.  She is also been using daily NSAIDs due to frontal and sinus headaches.  She describes right ear pain, postnasal drainage and thick drainage.  She denies cough, shortness of breath, or wheezing.  She is a chronic smoker who is trying to quit.  No melena.  Positive review of systems for constipation.  No weight loss.  Her weight is been stable.  She is always been thin.  Chronic smoker: Trying to wean herself from cigarettes.  Was smoking a pack per day, now down to 4 to 5/day.  She has history of depression with anxiety thus Chantix is not a good option.  She denies history of COPD.  Health maintenance: Overdue for breast cancer screening, colon cancer screening and a complete physical.  Review of systems is positive for urinary incontinence.  Assessment  1. Acute non-recurrent maxillary sinusitis   2. NSAID induced gastritis   3. Nicotine dependence, cigarettes, uncomplicated      Plan   Sinusitis: Amoxicillin x7 days and supportive care.  Stop NSAIDs  Gastritis: Post viral and possibly worsened by NSAID  use.  Education given.  Change to omeprazole daily.  Smoking cessation counseling done.  Patient is likely to be successful.  Discussed adjunctive options including Nicorette gum or lozenges.  Consideration of patch if needed.  At this point, behavioral issues are the biggest barrier to success.  Influenza vaccination given today  Follow up: 3 months for complete physical with Pap smear No orders of the defined types were placed in this encounter.  Meds ordered this encounter  Medications  . omeprazole (PRILOSEC) 20 MG capsule    Sig: Take 1 capsule (20 mg total) by mouth daily.    Dispense:  30 capsule    Refill:  3     No flowsheet data found.  We updated and reviewed the patient's past history in detail and it is documented below.  Patient Active Problem List   Diagnosis Date Noted  . Nicotine dependence, cigarettes, uncomplicated 82/99/3716   Health Maintenance  Topic Date Due  . HIV Screening  11/23/1984  . TETANUS/TDAP  11/23/1988  . PAP SMEAR  11/24/1990  . INFLUENZA VACCINE  10/16/2017    There is no immunization history on file for this patient. Current Meds  Medication Sig  . Ascorbic Acid (VITAMIN C) 1000 MG tablet Take 1,000 mg by mouth daily.  . diphenhydrAMINE (BENADRYL ALLERGY) 25 mg capsule Take 25 mg by mouth every 6 (six) hours as needed.  Marland Kitchen  phenylephrine (SUDAFED PE) 10 MG TABS tablet Take 10 mg by mouth every 4 (four) hours as needed.  . [DISCONTINUED] naproxen sodium (ANAPROX) 220 MG tablet Take 440 mg by mouth 2 (two) times daily as needed. Pain   . [DISCONTINUED] ranitidine (ZANTAC) 150 MG tablet Take 150 mg by mouth 2 (two) times daily.    Allergies: Patient is allergic to sulfa antibiotics. Past Medical History Patient  has a past medical history of Anxiety, Arthritis, Depression, GERD (gastroesophageal reflux disease), Hyperlipidemia, and Migraines. Past Surgical History Patient  has a past surgical history that includes Tubal ligation; Hip  arthroscopy (03/20/2011); Cleft palate repair; and Tympanic membrane repair. Family History: Patient family history includes Healthy in her son; Hypertension in her son. She was adopted. Social History:  Patient  reports that she has been smoking cigarettes. She has a 23.00 pack-year smoking history. She has never used smokeless tobacco. She reports that she does not drink alcohol or use drugs.  Review of Systems: Constitutional: negative for fever or malaise Ophthalmic: negative for photophobia, double vision or loss of vision Cardiovascular: negative for chest pain, dyspnea on exertion, or new LE swelling Respiratory: negative for SOB or persistent cough Gastrointestinal: negative for change in bowel habits or melena Genitourinary: negative for dysuria or gross hematuria Musculoskeletal: negative for new gait disturbance or muscular weakness Integumentary: negative for new or persistent rashes Neurological: negative for TIA or stroke symptoms Psychiatric: negative for SI or delusions Allergic/Immunologic: negative for hives  Patient Care Team    Relationship Specialty Notifications Start End  Leamon Arnt, MD PCP - General Family Medicine  12/04/17     Objective  Vitals: BP 100/68   Pulse 84   Temp 98 F (36.7 C)   Ht 5\' 2"  (1.575 m)   Wt 91 lb 12.8 oz (41.6 kg)   SpO2 97%   BMI 16.79 kg/m  General:  Well developed, well nourished, no acute distress, thin Psych:  Alert and oriented,normal mood and affect, mildly anxious HEENT:  Normocephalic, atraumatic, non-icteric sclera, PERRL, oropharynx is without mass or exudate, supple neck without adenopathy, mass or thyromegaly, bilateral sinus tenderness present, nasal mucosa is inflamed Cardiovascular:  RRR without gallop, rub or murmur, nondisplaced PMI Respiratory:  Good breath sounds bilaterally, CTAB with normal respiratory effort Gastrointestinal: normal bowel sounds, soft, mild epigastric tenderness present without rebound or  guarding no noted masses. No HSM MSK: no deformities, contusions. Joints are without erythema or swelling Skin:  Warm, no rashes or suspicious lesions noted Neurologic:    Mental status is normal. Gross motor and sensory exams are normal. Normal gait   Commons side effects, risks, benefits, and alternatives for medications and treatment plan prescribed today were discussed, and the patient expressed understanding of the given instructions. Patient is instructed to call or message via MyChart if he/she has any questions or concerns regarding our treatment plan. No barriers to understanding were identified. We discussed Red Flag symptoms and signs in detail. Patient expressed understanding regarding what to do in case of urgent or emergency type symptoms.   Medication list was reconciled, printed and provided to the patient in AVS. Patient instructions and summary information was reviewed with the patient as documented in the AVS. This note was prepared with assistance of Dragon voice recognition software. Occasional wrong-word or sound-a-like substitutions may have occurred due to the inherent limitations of voice recognition software

## 2018-03-02 ENCOUNTER — Telehealth: Payer: Self-pay | Admitting: Family Medicine

## 2018-03-02 NOTE — Telephone Encounter (Signed)
Copied from [place] [phone]. Topic: Quick Communication - See Telephone Encounter >> Mar 02, 2018 10:41 AM Blase Mess A wrote: CRM for notification. See Telephone encounter for: 03/02/18.  Patient is calling to get the name of the lab that [place] will be run through for CPE please advise today prior to 2pm. [phone]

## 2018-03-02 NOTE — Telephone Encounter (Signed)
FYI

## 2018-03-02 NOTE — Telephone Encounter (Signed)
Patient advised that most labs are ran through [facility], but it depending on which labs are ordered, we would sent out using [facility]. Patient states they will contact their insurance company.  Doloris Hall,  LPN

## 2018-03-03 ENCOUNTER — Encounter: Payer: BLUE CROSS/BLUE SHIELD | Admitting: Family Medicine

## 2018-03-06 ENCOUNTER — Other Ambulatory Visit (HOSPITAL_COMMUNITY)
Admission: RE | Admit: 2018-03-06 | Discharge: 2018-03-06 | Disposition: A | Discharge status: Home / Self Care | Payer: BLUE CROSS/BLUE SHIELD | Source: Ambulatory Visit | Attending: Family Medicine | Admitting: Family Medicine

## 2018-03-06 ENCOUNTER — Ambulatory Visit (INDEPENDENT_AMBULATORY_CARE_PROVIDER_SITE_OTHER): Payer: BLUE CROSS/BLUE SHIELD | Admitting: Family Medicine

## 2018-03-06 ENCOUNTER — Other Ambulatory Visit: Payer: Self-pay

## 2018-03-06 ENCOUNTER — Encounter: Payer: Self-pay | Admitting: Family Medicine

## 2018-03-06 VITALS — BP 96/60 | HR 57 | Temp 97.6°F | Resp 14 | Ht 62.0 in | Wt 94.6 lb

## 2018-03-06 DIAGNOSIS — N393 Stress incontinence (female) (male): Secondary | ICD-10-CM

## 2018-03-06 DIAGNOSIS — F17210 Nicotine dependence, cigarettes, uncomplicated: Secondary | ICD-10-CM

## 2018-03-06 DIAGNOSIS — N3281 Overactive bladder: Secondary | ICD-10-CM

## 2018-03-06 DIAGNOSIS — Z124 Encounter for screening for malignant neoplasm of cervix: Secondary | ICD-10-CM

## 2018-03-06 DIAGNOSIS — Z23 Encounter for immunization: Secondary | ICD-10-CM

## 2018-03-06 DIAGNOSIS — K2960 Other gastritis without bleeding: Secondary | ICD-10-CM | POA: Diagnosis not present

## 2018-03-06 DIAGNOSIS — Z716 Tobacco abuse counseling: Secondary | ICD-10-CM

## 2018-03-06 DIAGNOSIS — Z0000 Encounter for general adult medical examination without abnormal findings: Secondary | ICD-10-CM

## 2018-03-06 DIAGNOSIS — T39395A Adverse effect of other nonsteroidal anti-inflammatory drugs [NSAID], initial encounter: Secondary | ICD-10-CM

## 2018-03-06 DIAGNOSIS — Z1239 Encounter for other screening for malignant neoplasm of breast: Secondary | ICD-10-CM

## 2018-03-06 LAB — CBC WITH DIFFERENTIAL/PLATELET
Basophils Absolute: 0 10*3/uL (ref 0.0–0.1)
Basophils Relative: 0.3 % (ref 0.0–3.0)
EOS PCT: 1.1 % (ref 0.0–5.0)
Eosinophils Absolute: 0.1 10*3/uL (ref 0.0–0.7)
HCT: 43.3 % (ref 36.0–46.0)
HEMOGLOBIN: 14.8 g/dL (ref 12.0–15.0)
Lymphocytes Relative: 40.1 % (ref 12.0–46.0)
Lymphs Abs: 2.2 10*3/uL (ref 0.7–4.0)
MCHC: 34.1 g/dL (ref 30.0–36.0)
MCV: 96 fl (ref 78.0–100.0)
MONOS PCT: 8.9 % (ref 3.0–12.0)
Monocytes Absolute: 0.5 10*3/uL (ref 0.1–1.0)
Neutro Abs: 2.7 10*3/uL (ref 1.4–7.7)
Neutrophils Relative %: 49.6 % (ref 43.0–77.0)
Platelets: 178 10*3/uL (ref 150.0–400.0)
RBC: 4.51 Mil/uL (ref 3.87–5.11)
RDW: 12.5 % (ref 11.5–15.5)
WBC: 5.5 10*3/uL (ref 4.0–10.5)

## 2018-03-06 LAB — LIPID PANEL
CHOLESTEROL: 196 mg/dL (ref 0–200)
HDL: 71.6 mg/dL (ref >39.00)
LDL Cholesterol: 110 mg/dL — ABNORMAL HIGH (ref 0–99)
NonHDL: 124.67
Total CHOL/HDL Ratio: 3
Triglycerides: 71 mg/dL (ref 0.0–149.0)
VLDL: 14.2 mg/dL (ref 0.0–40.0)

## 2018-03-06 LAB — COMPREHENSIVE METABOLIC PANEL
ALBUMIN: 4.2 g/dL (ref 3.5–5.2)
ALK PHOS: 55 U/L (ref 39–117)
ALT: 20 U/L (ref 0–35)
AST: 22 U/L (ref 0–37)
BILIRUBIN TOTAL: 0.8 mg/dL (ref 0.2–1.2)
BUN: 13 mg/dL (ref 6–23)
CO2: 30 mEq/L (ref 19–32)
CREATININE: 0.79 mg/dL (ref 0.40–1.20)
Calcium: 9.4 mg/dL (ref 8.4–10.5)
Chloride: 106 mEq/L (ref 96–112)
GFR: 82.46 mL/min (ref >60.00)
Glucose, Bld: 82 mg/dL (ref 70–99)
Potassium: 3.6 mEq/L (ref 3.5–5.1)
SODIUM: 141 meq/L (ref 135–145)
TOTAL PROTEIN: 6.8 g/dL (ref 6.0–8.3)

## 2018-03-06 MED ORDER — OXYBUTYNIN CHLORIDE ER 10 MG PO TB24
10.0000 mg | ORAL_TABLET | Freq: Every day | ORAL | 5 refills | Status: DC
Start: 2018-03-06 — End: 2018-08-13

## 2018-03-06 NOTE — Patient Instructions (Signed)
Please return in 12 months for your annual complete physical; please come fasting.  Today you were given your Tdap vaccination.  Start the new bladder medication to help with the overactive bladder symptoms.   We will call you with information regarding your referral appointment. Mammogram at the [facility]. If you do not hear from [place] within the next 2 weeks, please let me know. It can take 1-2 weeks to get appointments set up with the specialists.   Stop smoking! You are so close! :)  If you have any questions or concerns, please don't hesitate to send me a message via MyChart or call the office at [phone]. Thank you for visiting with [place] today! It's our pleasure caring for you.  Please do these things to maintain good health!   Exercise at least 30-45 minutes a day,  4-5 days a week.   Eat a low-fat diet with lots of fruits and vegetables, up to 7-9 servings per day.  Drink plenty of water daily. Try to drink 8 8oz glasses per day.  Seatbelts can save your life. Always wear your seatbelt.  Place Smoke Detectors on every level of your home and check batteries every year.  Schedule an appointment with an eye doctor for an eye exam every 1-2 years  Safe sex - use condoms to protect yourself from STDs if you could be exposed to these types of infections. Use birth control if you do not want to become pregnant and are sexually active.  Avoid heavy alcohol use. If you drink, keep it to less than 2 drinks/day and not every day.  [place].  Choose someone you trust that could speak for you if you became unable to speak for yourself.  Depression is common in our stressful world.If you're feeling down or losing interest in things you normally enjoy, please come in for a visit.  If anyone is threatening or hurting you, please get help. Physical or Emotional Violence is never OK.    Kegel Exercises Kegel exercises help strengthen the muscles that support the  rectum, vagina, small intestine, bladder, and uterus. Doing Kegel exercises can help:  Improve bladder and bowel control.  Improve sexual response.  Reduce problems and discomfort during pregnancy. Kegel exercises involve squeezing your pelvic floor muscles, which are the same muscles you squeeze when you try to stop the flow of urine. The exercises can be done while sitting, standing, or lying down, but it is best to vary your position. Exercises 1. Squeeze your pelvic floor muscles tight. You should feel a tight lift in your rectal area. If you are a female, you should also feel a tightness in your vaginal area. Keep your stomach, buttocks, and legs relaxed. 2. Hold the muscles tight for up to 10 seconds. 3. Relax your muscles. Repeat this exercise 50 times a day or as many times as told by your health care provider. Continue to do this exercise for at least 4-6 weeks or for as long as told by your health care provider. This information is not intended to replace advice given to you by your health care provider. Make sure you discuss any questions you have with your health care provider. Document Released: 02/19/2012 Document Revised: 07/15/2016 Document Reviewed: 01/22/2015 Elsevier Interactive Patient Education  2019 [employer].   Overactive Bladder, Adult  Overactive bladder refers to a condition in which a person has a sudden need to pass urine. The person may leak urine if he or  she cannot get to the bathroom fast enough (urinary incontinence). A person with this condition may also wake up several times in the night to go to the bathroom. Overactive bladder is associated with poor nerve signals between your bladder and your brain. Your bladder may get the signal to empty before it is full. You may also have very sensitive muscles that make your bladder squeeze too soon. These symptoms might interfere with daily work or social activities. What are the causes? This condition may be  associated with or caused by:  Urinary tract infection.  Infection of nearby tissues, such as the prostate.  Prostate enlargement.  Surgery on the uterus or urethra.  Bladder stones, inflammation, or tumors.  Drinking too much caffeine or alcohol.  Certain medicines, especially medicines that get rid of extra fluid in the body (diuretics).  Muscle or nerve weakness, especially from: ? A spinal cord injury. ? Stroke. ? Multiple sclerosis. ? Parkinson's disease.  Diabetes.  Constipation. What increases the risk? You may be at greater risk for overactive bladder if you:  Are an older adult.  Smoke.  Are going through menopause.  Have prostate problems.  Have a neurological disease, such as stroke, dementia, Parkinson's disease, or multiple sclerosis (MS).  Eat or drink things that irritate the bladder. These include alcohol, spicy food, and caffeine.  Are overweight or obese. What are the signs or symptoms? Symptoms of this condition include:  Sudden, strong urge to urinate.  Leaking urine.  Urinating 8 or more times a day.  Waking up to urinate 2 or more times a night. How is this diagnosed? Your health care provider may suspect overactive bladder based on your symptoms. He or she will diagnose this condition by:  A physical exam and medical history.  Blood or urine tests. You might need bladder or urine tests to help determine what is causing your overactive bladder. You might also need to see a health care provider who specializes in urinary tract problems (urologist). How is this treated? Treatment for overactive bladder depends on the cause of your condition and whether it is mild or severe. You can also make lifestyle changes at home. Options include:  Bladder training. This may include: ? Learning to control the urge to urinate by following a schedule that directs you to urinate at regular intervals (timed voiding). ? Doing Kegel exercises to  strengthen your pelvic floor muscles, which support your bladder. Toning these muscles can help you control urination, even if your bladder muscles are overactive.  Special devices. This may include: ? Biofeedback, which uses sensors to help you become aware of your body's signals. ? Electrical stimulation, which uses electrodes placed inside the body (implanted) or outside the body. These electrodes send gentle pulses of electricity to strengthen the nerves or muscles that control the bladder. ? Women may use a plastic device that fits into the vagina and supports the bladder (pessary).  Medicines. ? Antibiotics to treat bladder infection. ? Antispasmodics to stop the bladder from releasing urine at the wrong time. ? Tricyclic antidepressants to relax bladder muscles. ? Injections of botulinum toxin type A directly into the bladder tissue to relax bladder muscles.  Lifestyle changes. This may include: ? Weight loss. Talk to your health care provider about weight loss methods that would work best for you. ? Diet changes. This may include reducing how much alcohol and caffeine you consume, or drinking fluids at different times of the day. ? Not smoking. Do not use  any products that contain nicotine or tobacco, such as cigarettes and e-cigarettes. If you need help quitting, ask your health care provider.  Surgery. ? A device may be implanted to help manage the nerve signals that control urination. ? An electrode may be implanted to stimulate electrical signals in the bladder. ? A procedure may be done to change the shape of the bladder. This is done only in very severe cases. Follow these instructions at home: Lifestyle  Make any diet or lifestyle changes that are recommended by your health care provider. These may include: ? Drinking less fluid or drinking fluids at different times of the day. ? Cutting down on caffeine or alcohol. ? Doing Kegel exercises. ? Losing weight if  needed. ? Eating a healthy and balanced diet to prevent constipation. This may include:  Eating foods that are high in fiber, such as fresh fruits and vegetables, whole grains, and beans.  Limiting foods that are high in fat and processed sugars, such as fried and sweet foods. General instructions  Take over-the-counter and prescription medicines only as told by your health care provider.  If you were prescribed an antibiotic medicine, take it as told by your health care provider. Do not stop taking the antibiotic even if you start to feel better.  Use any implants or pessary as told by your health care provider.  If needed, wear pads to absorb urine leakage.  Keep a journal or log to track how much and when you drink and when you feel the need to urinate. This will help your health care provider monitor your condition.  Keep all follow-up visits as told by your health care provider. This is important. Contact a health care provider if:  You have a fever.  Your symptoms do not get better with treatment.  Your pain and discomfort get worse.  You have more frequent urges to urinate. Get help right away if:  You are not able to control your bladder. Summary  Overactive bladder refers to a condition in which a person has a sudden need to pass urine.  Several conditions may lead to an overactive bladder.  Treatment for overactive bladder depends on the cause and severity of your condition.  Follow your health care provider's instructions about lifestyle changes, doing Kegel exercises, keeping a journal, and taking medicines. This information is not intended to replace advice given to you by your health care provider. Make sure you discuss any questions you have with your health care provider. Document Released: 12/29/2008 Document Revised: 03/20/2017 Document Reviewed: 03/20/2017 Elsevier Interactive Patient Education  2019 [employer].

## 2018-03-06 NOTE — Progress Notes (Signed)
Subjective  Chief Complaint  Patient presents with  . Annual Exam    needs pap, mammo, tdap.   Marland Kitchen Urinary Incontinence    leaks with sneezing  . Nicotine Dependence    down to 1-2 cigs per day    HPI: Linda Pugh is a 47 y.o. female who presents to [place] at [facility] today for a Female Wellness Visit.   Wellness Visit: annual visit with health maintenance review and exam with Pap   47 year old G2, P2 presents for annual exam.  She is due for mammogram and cervical cancer screening.  Overall she is doing well.  She was here in September and treated for NSAID induced gastritis with Prilosec.  Those symptoms have now resolved on the medicine.  Certain foods will exacerbate her symptoms but overall it is well controlled.  Tobacco cessation: She is done very well with cutting back on her smoking.  She is down from 1 pack/day to 1 to 2 cigarettes daily.  Those 2 cigarettes help with stress: Her father was recently diagnosed with prostate cancer and her son is in trouble so she does not feel that she is able to quit this 2 cigarettes just yet.  Her goal is to quit completely soon.  She has no known complications from smoking  She does complain of mild urinary incontinence with sneezing or coughing.  She also experiences urgency 2-3 times weekly.  She denies dysuria, gross hematuria or prolapse symptoms.  She does drink caffeine about twice per day.  Assessment  1. Annual physical exam   2. Nicotine dependence, cigarettes, uncomplicated   3. NSAID induced gastritis   4. Cervical cancer screening   5. Breast cancer screening   6. Tobacco abuse counseling   7. Overactive bladder   8. SUI (stress urinary incontinence, female)      Plan  Female Wellness Visit:  Age appropriate Health Maintenance and Prevention measures were discussed with patient. Included topics are cancer screening recommendations, ways to keep healthy (see AVS) including dietary and exercise  recommendations, regular eye and dental care, use of seat belts, and avoidance of moderate alcohol use and tobacco use.  Mammogram ordered.  Pap smear with high-risk HPV testing done today.  BMI: discussed patient's BMI and encouraged positive lifestyle modifications to help get to or maintain a target BMI.  HM needs and immunizations were addressed and ordered. See below for orders. See HM and immunization section for updates.  Tdap today.  Routine labs and screening tests ordered including cmp, cbc and lipids where appropriate.  Discussed recommendations regarding Vit D and calcium supplementation (see AVS)   Overactive bladder and stress urinary incontinence: Education given.  Counseling done.  Recommend [place] exercises, decrease caffeine and trial of Ditropan.  Patient will follow-up if worsens or has problems with medications   tobacco cessation: Counseling again given.  Patient to quit within the next 1 to 3 months.  She will follow-up here if she is having trouble with that.  Gastritis: Resolved.  Continue Prilosec for now.  Follow up: Return in about 1 year (around 03/07/2019) for complete physical.   Orders Placed This Encounter  Procedures  . MM DIGITAL SCREENING BILATERAL  . Tdap vaccine greater than or equal to 7yo IM  . CBC with Differential/Platelet  . Comprehensive metabolic panel  . Lipid panel  . HIV Antibody (routine testing w rflx)   Meds ordered this encounter  Medications  . oxybutynin (DITROPAN XL) 10 MG 24 hr  tablet    Sig: Take 1 tablet (10 mg total) by mouth at bedtime.    Dispense:  30 tablet    Refill:  5     Lifestyle: Body mass index is 17.3 kg/m. Wt Readings from Last 3 Encounters:  03/06/18 94 lb 9.6 oz (42.9 kg)  12/04/17 91 lb 12.8 oz (41.6 kg)  10/26/12 90 lb (40.8 kg)     Patient Active Problem List   Diagnosis Date Noted  . Overactive bladder 03/06/2018  . SUI (stress urinary incontinence, female) 03/06/2018  . Nicotine dependence,  cigarettes, uncomplicated 99/37/1696   Health Maintenance  Topic Date Due  . HIV Screening  11/23/1984  . TETANUS/TDAP  11/23/1988  . PAP SMEAR-Modifier  11/24/1990  . MAMMOGRAM  07/25/2012  . INFLUENZA VACCINE  Completed   Immunization History  Administered Date(s) Administered  . Influenza,inj,Quad PF,6+ Mos 12/04/2017  . Tdap 03/06/2018   We updated and reviewed the patient's past history in detail and it is documented below. Allergies: Patient is allergic to sulfa antibiotics. Past Medical History Patient  has a past medical history of Anxiety, Arthritis, Depression, GERD (gastroesophageal reflux disease), Hyperlipidemia, and Migraines. Past Surgical History Patient  has a past surgical history that includes Tubal ligation; Hip arthroscopy (03/20/2011); Cleft palate repair; and Tympanic membrane repair. Family History: Patient family history includes Healthy in her son; Hypertension in her son. She was adopted. Social History:  Patient  reports that she has been smoking cigarettes. She has smoked for the past 23.00 years. She has never used smokeless tobacco. She reports that she does not drink alcohol or use drugs.  Review of Systems: Constitutional: negative for fever or malaise Ophthalmic: negative for photophobia, double vision or loss of vision Cardiovascular: negative for chest pain, dyspnea on exertion, or new LE swelling Respiratory: negative for SOB or persistent cough Gastrointestinal: negative for abdominal pain, change in bowel habits or melena Genitourinary: negative for dysuria or gross hematuria, no abnormal uterine bleeding or disharge Musculoskeletal: negative for new gait disturbance or muscular weakness Integumentary: negative for new or persistent rashes, no breast lumps Neurological: negative for TIA or stroke symptoms Psychiatric: negative for SI or delusions Allergic/Immunologic: negative for hives  Patient Care Team    Relationship Specialty  Notifications Start End  Leamon Arnt, MD PCP - General Family Medicine  12/04/17     Objective  Vitals: BP 96/60   Pulse (!) 57   Temp 97.6 F (36.4 C) (Oral)   Resp 14   Ht 5\' 2"  (1.575 m)   Wt 94 lb 9.6 oz (42.9 kg)   LMP 10/12/2012   SpO2 98%   BMI 17.30 kg/m  General:  Well developed, well nourished, no acute distress, thin small frame Psych:  Alert and orientedx3,normal mood and affect HEENT:  Normocephalic, atraumatic, non-icteric sclera, PERRL, oropharynx is clear without mass or exudate, supple neck without adenopathy, mass or thyromegaly Cardiovascular:  Normal S1, S2, RRR without gallop, rub or murmur, nondisplaced PMI Respiratory:  Good breath sounds bilaterally, CTAB with normal respiratory effort Gastrointestinal: normal bowel sounds, soft, non-tender, no noted masses. No HSM MSK: no deformities, contusions. Joints are without erythema or swelling. Spine and CVA region are nontender Skin:  Warm, no rashes or suspicious lesions noted Neurologic:    Mental status is normal. CN 2-11 are normal. Gross motor and sensory exams are normal. Normal gait. No tremor Breast Exam: No mass, skin retraction or nipple discharge is appreciated in either breast. No axillary adenopathy. Fibrocystic  changes are not noted Pelvic Exam: Normal external genitalia, no vulvar or vaginal lesions present. Clear cervix w/o CMT. Bimanual exam reveals a nontender fundus w/o masses, nl size. No adnexal masses present. No inguinal adenopathy. A PAP smear was performed.   Commons side effects, risks, benefits, and alternatives for medications and treatment plan prescribed today were discussed, and the patient expressed understanding of the given instructions. Patient is instructed to call or message via MyChart if he/she has any questions or concerns regarding our treatment plan. No barriers to understanding were identified. We discussed Red Flag symptoms and signs in detail. Patient expressed  understanding regarding what to do in case of urgent or emergency type symptoms.   Medication list was reconciled, printed and provided to the patient in AVS. Patient instructions and summary information was reviewed with the patient as documented in the AVS. This note was prepared with assistance of Dragon voice recognition software. Occasional wrong-word or sound-a-like substitutions may have occurred due to the inherent limitations of voice recognition software

## 2018-03-07 LAB — HIV ANTIBODY (ROUTINE TESTING W REFLEX): HIV: NONREACTIVE

## 2018-03-13 ENCOUNTER — Encounter: Payer: Self-pay | Admitting: Family Medicine

## 2018-03-13 ENCOUNTER — Telehealth: Payer: Self-pay | Admitting: *Deleted

## 2018-03-13 DIAGNOSIS — A5901 Trichomonal vulvovaginitis: Secondary | ICD-10-CM | POA: Insufficient documentation

## 2018-03-13 HISTORY — DX: Trichomonal vulvovaginitis: A59.01

## 2018-03-13 LAB — CYTOLOGY - PAP
DIAGNOSIS: NEGATIVE
HPV (WINDOPATH): NOT DETECTED

## 2018-03-13 MED ORDER — METRONIDAZOLE 500 MG PO TABS: 2000.0000 mg | ORAL_TABLET | Freq: Once | ORAL | 0 refills | Status: AC

## 2018-03-13 NOTE — Progress Notes (Signed)
Please call patient: I have reviewed his/her lab results. Pap smear results are in: no sign of cervical cancer, so that is good. Can repeat that in 5 years. However, it does show an STD call trichomonas. I have ordered antibiotics to take; her partner will need to be tested and treated as well. I recommend considering returning for further STD testing to ensure there are no other infections. She can schedule an office visit for that and we can explain more about the trichomonas. I have ordered the antibiotics.

## 2018-03-13 NOTE — Telephone Encounter (Signed)
See result note.  

## 2018-03-13 NOTE — Progress Notes (Signed)
Please call patient: I have reviewed hi. s/her lab results. All lab testing is normal. May mail letter or labs to her if she would like. I was waiting on the pap test results which should be back soon

## 2018-03-13 NOTE — Telephone Encounter (Signed)
Please advise  Copied from [place] [phone]. Topic: Quick Communication - Lab Results (Clinic Use ONLY) >> Mar 13, 2018 12:38 PM Lennox Solders wrote: Pt would like blood work results from 03-06-18

## 2018-03-13 NOTE — Addendum Note (Signed)
Addended by: Billey Chang on: 03/13/2018 04:21 PM   Modules accepted: Orders

## 2018-03-27 ENCOUNTER — Other Ambulatory Visit: Payer: Self-pay | Admitting: Family Medicine

## 2018-03-31 ENCOUNTER — Ambulatory Visit
Admission: RE | Admit: 2018-03-31 | Discharge: 2018-03-31 | Disposition: A | Discharge status: Home / Self Care | Payer: BLUE CROSS/BLUE SHIELD | Source: Ambulatory Visit | Attending: Family Medicine | Admitting: Family Medicine

## 2018-03-31 DIAGNOSIS — Z1231 Encounter for screening mammogram for malignant neoplasm of breast: Secondary | ICD-10-CM | POA: Diagnosis not present

## 2018-03-31 DIAGNOSIS — Z1239 Encounter for other screening for malignant neoplasm of breast: Secondary | ICD-10-CM

## 2018-04-02 ENCOUNTER — Ambulatory Visit: Payer: BLUE CROSS/BLUE SHIELD | Admitting: Family Medicine

## 2018-04-03 ENCOUNTER — Encounter: Payer: Self-pay | Admitting: Family Medicine

## 2018-04-03 ENCOUNTER — Other Ambulatory Visit: Payer: Self-pay

## 2018-04-03 ENCOUNTER — Other Ambulatory Visit (HOSPITAL_COMMUNITY)
Admission: RE | Admit: 2018-04-03 | Discharge: 2018-04-03 | Disposition: A | Discharge status: Home / Self Care | Payer: BLUE CROSS/BLUE SHIELD | Source: Ambulatory Visit | Attending: Family Medicine | Admitting: Family Medicine

## 2018-04-03 ENCOUNTER — Ambulatory Visit: Payer: BLUE CROSS/BLUE SHIELD | Admitting: Family Medicine

## 2018-04-03 VITALS — BP 96/60 | HR 67 | Temp 97.8°F | Resp 14 | Ht 62.0 in | Wt 94.0 lb

## 2018-04-03 DIAGNOSIS — F430 Acute stress reaction: Secondary | ICD-10-CM

## 2018-04-03 DIAGNOSIS — A5901 Trichomonal vulvovaginitis: Secondary | ICD-10-CM

## 2018-04-03 DIAGNOSIS — Z716 Tobacco abuse counseling: Secondary | ICD-10-CM | POA: Diagnosis not present

## 2018-04-03 MED ORDER — ALPRAZOLAM 0.5 MG PO TABS: 0.2500 mg | ORAL_TABLET | Freq: Every day | ORAL | 0 refills | Status: AC | PRN

## 2018-04-03 MED ORDER — ESCITALOPRAM OXALATE 5 MG PO TABS: 5.0000 mg | ORAL_TABLET | Freq: Every day | ORAL | 2 refills | Status: DC

## 2018-04-03 NOTE — Progress Notes (Signed)
Subjective  CC:  Chief Complaint  Patient presents with  . Follow-up    Mammogram, Pap results and treatment.. She did complete TX     HPI: Linda Pugh is a 48 y.o. female who presents to the office today to address the problems listed above in the chief complaint.  Linda Pugh returns for follow-up of trichomonas vaginitis that was found on her recent Pap smear.  Pap smear was normal with negative HPV testing.  She completed her Flagyl treatment without problems.  She reports her last date of sexual intercourse was over 2 years ago.  She is asymptomatic.  No history of STDs.  Not currently sexually active.  Stress: Complains of worsening anxiety.  Having problems at work due to interpersonal conflicts.  She reports high anxiety levels, irritability, short temper, and feeling unhappy.  When she is home, she feels better.  She is sleeping fine, appetite is fine.  She denies low mood at home.  She does have history of depression anxiety treated several years back with Xanax.  She denies panic attacks.  Her biologic mother does have some mental health issues.  She was raised by her adopted mother.  Because of her increased anxiety, she has not been able to quit smoking completely although she continues to try. Assessment  1. Stress reaction   2. Trichomonal vaginitis   3. Tobacco abuse counseling      Plan   Stress reaction: Counseling done.  Recommend starting low-dose Lexapro.  PRN low-dose Xanax.  Close follow-up.  Trichomonas: Education and counseling given.  STD prevention discussed.  Screen for other STDs with blood work and urine testing today.  Tobacco counseling: Current goal is to ensure that levels are not increasing.  When stress is under better control, will restart working on cessation.  Follow up: Return in about 4 weeks (around 05/01/2018) for mood follow up.  Visit date not found  Orders Placed This Encounter  Procedures  . RPR   Meds ordered this encounter    Medications  . escitalopram (LEXAPRO) 5 MG tablet    Sig: Take 1 tablet (5 mg total) by mouth daily.    Dispense:  30 tablet    Refill:  2  . ALPRAZolam (XANAX) 0.5 MG tablet    Sig: Take 0.5 tablets (0.25 mg total) by mouth daily as needed for anxiety.    Dispense:  10 tablet    Refill:  0      I reviewed the patients updated PMH, FH, and SocHx.    Patient Active Problem List   Diagnosis Date Noted  . Trichomonal vaginitis 03/13/2018  . Overactive bladder 03/06/2018  . SUI (stress urinary incontinence, female) 03/06/2018  . Nicotine dependence, cigarettes, uncomplicated 34/28/7681   Current Meds  Medication Sig  . Ascorbic Acid (VITAMIN C) 1000 MG tablet Take 1,000 mg by mouth daily.  Marland Kitchen omeprazole (PRILOSEC) 20 MG capsule TAKE 1 CAPSULE BY MOUTH EVERY DAY  . oxybutynin (DITROPAN XL) 10 MG 24 hr tablet Take 1 tablet (10 mg total) by mouth at bedtime.  . phenylephrine (SUDAFED PE) 10 MG TABS tablet Take 10 mg by mouth every 4 (four) hours as needed.    Allergies: Patient is allergic to sulfa antibiotics. Family History: Patient family history includes Healthy in her son; Hypertension in her son. She was adopted. Social History:  Patient  reports that she has been smoking cigarettes. She has smoked for the past 23.00 years. She has never used smokeless tobacco. She  reports that she does not drink alcohol or use drugs.  Review of Systems: Constitutional: Negative for fever malaise or anorexia Cardiovascular: negative for chest pain Respiratory: negative for SOB or persistent cough Gastrointestinal: negative for abdominal pain  Objective  Vitals: BP 96/60   Pulse 67   Temp 97.8 F (36.6 C) (Oral)   Resp 14   Ht 5\' 2"  (1.575 m)   Wt 94 lb (42.6 kg)   LMP 10/12/2012   SpO2 97%   BMI 17.19 kg/m  General: no acute distress , A&Ox3 Psych: Anxious, worried but normal affect.     Commons side effects, risks, benefits, and alternatives for medications and treatment  plan prescribed today were discussed, and the patient expressed understanding of the given instructions. Patient is instructed to call or message via MyChart if he/she has any questions or concerns regarding our treatment plan. No barriers to understanding were identified. We discussed Red Flag symptoms and signs in detail. Patient expressed understanding regarding what to do in case of urgent or emergency type symptoms.   Medication list was reconciled, printed and provided to the patient in AVS. Patient instructions and summary information was reviewed with the patient as documented in the AVS. This note was prepared with assistance of Dragon voice recognition software. Occasional wrong-word or sound-a-like substitutions may have occurred due to the inherent limitations of voice recognition software

## 2018-04-03 NOTE — Patient Instructions (Addendum)
Please return in 4-6 weeks for recheck of your anxiety.   We will call you with your results.  Please start the lexapro daily to help with your anxiety. You may use xanax IF needed as well.    Trichomoniasis Trichomoniasis is an STI (sexually transmitted infection) that can affect both women and men. In women, the outer area of the female genitalia (vulva) and the vagina are affected. In men, the penis is mainly affected, but the prostate and other reproductive organs can also be involved. This condition can be treated with medicine. It often has no symptoms (is asymptomatic), especially in men. What are the causes? This condition is caused by an organism called Trichomonas vaginalis. Trichomoniasis most often spreads from person to person (is contagious) through sexual contact. What increases the risk? The following factors may make you more likely to develop this condition:  Having unprotected sexual intercourse.  Having sexual intercourse with a partner who has trichomoniasis.  Having multiple sexual partners.  Having had previous trichomoniasis infections or other STIs. What are the signs or symptoms? In women, symptoms of trichomoniasis include:  Abnormal vaginal discharge that is clear, white, gray, or yellow-green and foamy and has an unusual "fishy" odor.  Itching and irritation of the vagina and vulva.  Burning or pain during urination or sexual intercourse.  Genital redness and swelling. In men, symptoms of trichomoniasis include:  Penile discharge that may be foamy or contain pus.  Pain in the penis. This may happen only when urinating.  Itching or irritation inside the penis.  Burning after urination or ejaculation. How is this diagnosed? In women, this condition may be found during a routine Pap test or physical exam. It may be found in men during a routine physical exam. Your health care provider may perform tests to help diagnose this infection, such  as:  Urine tests (men and women).  The following in women: ? Testing the pH of the vagina. ? A vaginal swab test that checks for the Trichomonas vaginalis organism. ? Testing vaginal secretions. Your health care provider may test you for other STIs, including HIV (human immunodeficiency virus). How is this treated? This condition is treated with medicine taken by mouth (orally), such as metronidazole or tinidazole to fight the infection. Your sexual partner(s) may also need to be tested and treated.  If you are a woman and you plan to become pregnant or think you may be pregnant, tell your health care provider right away. Some medicines that are used to treat the infection should not be taken during pregnancy. Your health care provider may recommend over-the-counter medicines or creams to help relieve itching or irritation. You may be tested for infection again 3 months after treatment. Follow these instructions at home:  Take and use over-the-counter and prescription medicines, including creams, only as told by your health care provider.  Do not have sexual intercourse until one week after you finish your medicine, or until your health care provider approves. Ask your health care provider when you may resume sexual intercourse.  (Women) Do not douche or wear tampons while you have the infection.  Discuss your infection with your sexual partner(s). Make sure that your partner gets tested and treated, if necessary.  Keep all follow-up visits as told by your health care provider. This is important. How is this prevented?  Use condoms every time you have sex. Using condoms correctly and consistently can help protect against STIs.  Avoid having multiple sexual partners.  Talk with  your sexual partner about any symptoms that either of you may have, as well as any history of STIs.  Get tested for STIs and STDs (sexually transmitted diseases) before you have sex. Ask your partner to do the  same.  Do not have sexual contact if you have symptoms of trichomoniasis or another STI. Contact a health care provider if:  You still have symptoms after you finish your medicine.  You develop pain in your abdomen.  You have pain when you urinate.  You have bleeding after sexual intercourse.  You develop a rash.  You feel nauseous or you vomit.  You plan to become pregnant or think you may be pregnant. Summary  Trichomoniasis is an STI (sexually transmitted infection) that can affect both women and men.  This condition often has no symptoms (is asymptomatic), especially in men.  You should not have sexual intercourse until one week after you finish your medicine, or until your health care provider approves. Ask your health care provider when you may resume sexual intercourse.  Discuss your infection with your sexual partner. Make sure that your partner gets tested and treated, if necessary. This information is not intended to replace advice given to you by your health care provider. Make sure you discuss any questions you have with your health care provider. Document Released: 08/28/2000 Document Revised: 01/26/2016 Document Reviewed: 01/26/2016 Elsevier Interactive Patient Education  2019 Elsevier Inc.   Preventing Sexually Transmitted Infections, Adult Sexually transmitted infections (STIs) are diseases that are passed (transmitted) from person to person through bodily fluids exchanged during sex or sexual contact. Bodily fluids include saliva, semen, blood, vaginal mucus, and urine. You may have an increased risk for developing an STI if you have unprotected oral, vaginal, or anal sex. Some common STIs include:  Herpes.  Hepatitis B.  Chlamydia.  Gonorrhea.  Syphilis.  HPV (human papillomavirus).  HIV (human immunodeficiency virus), the virus that can cause AIDS (acquired immunodeficiency syndrome). How can I protect myself from sexually transmitted  infections? The only way to completely prevent STIs is not to have sex of any kind (practice abstinence). This includes oral, vaginal, or anal sex. If you are sexually active, take these actions to lower your risk of getting an STI:  Have only one sex partner (be monogamous) or limit the number of sexual partners you have.  Stay up-to-date on immunizations. Certain vaccines can lower your risk of getting certain STIs, such as: ? Hepatitis A and B vaccines. You may have been vaccinated as a young child, but likely need a booster shot as a teen or young adult. ? HPV vaccine.  Use methods that prevent the exchange of body fluids between partners (barrier protection) every time you have sex. Barrier protection can be used during oral, vaginal, or anal sex. Commonly used barrier methods include: ? Female condom. ? Female condom. ? Dental dam.  Get tested regularly for STIs. Have your sexual partner get tested regularly as well.  Avoid mixing alcohol, drugs, and sex. Alcohol and drug use can affect your ability to make good decisions and can lead to risky sexual behaviors.  Ask your health care provider about taking pre-exposure prophylaxis (PrEP) to prevent HIV infection if you: ? Have a HIV-positive sexual partner. ? Have multiple sexual partners or partners who do not know their HIV status, and do not regularly use a condom during sex. ? Use injection drugs and share needles. Birth control pills, injections, implants, and intrauterine devices (IUDs) do not protect against  STIs. To prevent both STIs and pregnancy, always use a condom with another form of birth control. Some STIs, such as herpes, are spread through skin to skin contact. A condom does not protect you from getting such STIs. If you or your partner have herpes and there is an active flare with open sores, avoid all sexual contact. Why are these changes important? Taking steps to practice safe sex protects you and others. Many STIs can  be cured. However, some STIs are not curable and will affect you for the rest of your life. STIs can be passed on to another person even if you do not have symptoms. What can happen if changes are not made? Certain STIs may:  Require you to take medicine for the rest of your life.  Affect your ability to have children (your fertility).  Increase your risk for developing another STI or certain serious health conditions, such as: ? Cervical cancer. ? Head and neck cancer. ? Pelvic inflammatory disease (PID) in women. ? Organ damage or damage to other parts of your body, if the infection spreads.  Be passed to a baby during childbirth. How are sexually transmitted infections treated? If you or your partner know or think that you may have an STI:  Talk with your health care provider about what can be done to treat it. Some STIs can be treated and cured with medicines.  For curable STIs, you and your partner should avoid sex during treatment and for several days after treatment is complete.  You and your partner should both be treated at the same time, if there is any chance that your partner is infected as well. If you get treatment but your partner does not, your partner can re-infect you when you resume sexual contact.  Do not have unprotected sex. Where to find more information Learn more about sexually transmitted diseases and infections from:  Centers for Disease Control and Prevention: ? More information about specific STIs: AppraiserFraud.fi ? Find places to get sexual health counseling and treatment for free or for a low cost: gettested.StoreMirror.com.cy  U.S. Department of Health and Human Services: http://white.info/.html Summary  The only way to completely prevent STIs is not to have sex (practice abstinence), including oral, vaginal, or anal sex.  STIs can spread through saliva, semen, blood, vaginal mucus,  urine, or sexual contact.  If you do have sex, limit your number of sexual partners and use a barrier protection method every time you have sex.  If you develop an STI, get treated right away and ask your partner to be treated as well. Do not resume having sex until both of you have completed treatment for the STI. This information is not intended to replace advice given to you by your health care provider. Make sure you discuss any questions you have with your health care provider. Document Released: 02/29/2016 Document Revised: 08/08/2017 Document Reviewed: 02/29/2016 Elsevier Interactive Patient Education  2019 [employer].

## 2018-04-05 LAB — RPR: RPR Ser Ql: NONREACTIVE

## 2018-04-06 ENCOUNTER — Encounter: Payer: Self-pay | Admitting: *Deleted

## 2018-04-06 LAB — URINE CYTOLOGY ANCILLARY ONLY
Chlamydia: NEGATIVE
Neisseria Gonorrhea: NEGATIVE

## 2018-04-06 NOTE — Progress Notes (Signed)
Please call patient: I have reviewed his/her lab results. Other STD testing is all negative.

## 2018-05-04 ENCOUNTER — Other Ambulatory Visit: Payer: Self-pay

## 2018-05-04 ENCOUNTER — Ambulatory Visit: Payer: BLUE CROSS/BLUE SHIELD | Admitting: Family Medicine

## 2018-05-04 ENCOUNTER — Encounter: Payer: Self-pay | Admitting: Family Medicine

## 2018-05-04 VITALS — BP 102/52 | HR 63 | Temp 97.9°F | Resp 14 | Ht 62.0 in | Wt 91.8 lb

## 2018-05-04 DIAGNOSIS — K219 Gastro-esophageal reflux disease without esophagitis: Secondary | ICD-10-CM

## 2018-05-04 DIAGNOSIS — F17210 Nicotine dependence, cigarettes, uncomplicated: Secondary | ICD-10-CM | POA: Diagnosis not present

## 2018-05-04 DIAGNOSIS — J301 Allergic rhinitis due to pollen: Secondary | ICD-10-CM

## 2018-05-04 DIAGNOSIS — H6983 Other specified disorders of Eustachian tube, bilateral: Secondary | ICD-10-CM | POA: Diagnosis not present

## 2018-05-04 DIAGNOSIS — F430 Acute stress reaction: Secondary | ICD-10-CM | POA: Diagnosis not present

## 2018-05-04 MED ORDER — CETIRIZINE HCL 10 MG PO TABS: 10.0000 mg | ORAL_TABLET | Freq: Every day | ORAL | 11 refills | Status: DC

## 2018-05-04 NOTE — Patient Instructions (Signed)
Please return in 3 months for recheck anxiety medications and smoking cessation.  Keep Quitting!!! You are doing great.   If you have any questions or concerns, please don't hesitate to send me a message via MyChart or call the office at [phone]. Thank you for visiting with [place] today! It's our pleasure caring for you.   Nicotine lozenge What is this medicine? NICOTINE ([place] teen) helps people stop smoking. The lozenges replace the nicotine found in cigarettes and help to decrease withdrawal effects. It is most effective when used in combination with a stop-smoking program. This medicine may be used for other purposes; ask your health care provider or pharmacist if you have questions. COMMON BRAND NAME(S): Commit, NICOrelief, Nicorette What should I tell my health care provider before I take this medicine? They need to know if you have any of these conditions: -diabetes -heart disease, angina, irregular heartbeat or previous heart attack -high blood pressure -lung disease, including asthma -overactive thyroid -pheochromocytoma -seizures or history of seizures -stomach problems or ulcers -an unusual or allergic reaction to nicotine, other medicines, foods, dyes, or preservatives -pregnant or trying to get pregnant -breast-feeding How should I use this medicine? Place the lozenge in the mouth. Suck on the lozenge until it is completely dissolved. Do not swallow the lozenge. Follow the directions carefully that come with the lozenge. Use exactly as directed. Do not use the lozenges more often than directed. Talk to your pediatrician regarding the use of this medicine in children. Special care may be needed. Overdosage: If you think you have taken too much of this medicine contact a poison control center or emergency room at once. NOTE: This medicine is only for you. Do not share this medicine with others. What if I miss a dose? This does not apply. What may interact with this  medicine? -medicines for asthma -medicines for blood pressure -medicines for mental depression This list may not describe all possible interactions. Give your health care provider a list of all the medicines, herbs, non-prescription drugs, or dietary supplements you use. Also tell them if you smoke, drink alcohol, or use illegal drugs. Some items may interact with your medicine. What should I watch for while using this medicine? Always carry the nicotine lozenges with you. You should begin using the nicotine lozenges the day you stop smoking. It is okay if you do not succeed with your attempt to quit and have a cigarette. You can still continue your quit attempt and keep using the product as directed. Just throw away your cigarettes and get back to your quit plan. If you are a diabetic and you quit smoking, the effects of insulin may be increased and you may need to reduce your insulin dose. Check with your doctor or health care professional about how you should adjust your insulin dose. Brush your teeth regularly to reduce mouth irritation. What side effects may I notice from receiving this medicine? Side effects that you should report to your doctor or health care professional as soon as possible: -allergic reactions like skin rash, itching or hives, swelling of the face, lips, or tongue -breathing problems -changes in hearing -changes in vision -chest pain -cold sweats -confusion -fast, irregular heartbeat -feeling faint or lightheaded, falls -headache -increased saliva -nausea, vomiting -stomach pain -weakness Side effects that usually do not require medical attention (report to your doctor or health care professional if they continue or are bothersome): -diarrhea -dry mouth -hiccups -irritability -nervousness or restlessness -trouble sleeping or vivid dreams This  list may not describe all possible side effects. Call your doctor for medical advice about side effects. You may  report side effects to FDA at 1-800-FDA-1088. Where should I keep my medicine? Keep out of the reach of children. Store at room temperature between 15 and 30 degrees C (59 and 86 degrees F). Protect from heat and light. Throw away unused medicine after the expiration date. NOTE: This sheet is a summary. It may not cover all possible information. If you have questions about this medicine, talk to your doctor, pharmacist, or health care provider.  2019 Elsevier/Gold Standard (2014-08-29 19:40:56)

## 2018-05-04 NOTE — Progress Notes (Signed)
Subjective  CC:  Chief Complaint  Patient presents with  . Anxiety    Lexapro is helping  . Nicotine Dependence    She has decreased to number of cigarettes she smokes, wants to discuss starting Nicorette    HPI: Linda Pugh is a 48 y.o. female who presents to the office today to address the problems listed above in the chief complaint, mood problems.  F/u stress/ anxiety: started on lexapro last visit: reports she is doing much better.  Lexapro has helped with coping, decreased irritability and no more panic.  She has not needed Xanax since last visit.  She continues to have stress in her work life and home life.  No adverse effects from medications.  Smoking cessation: Down to less than half pack per day from 1.5 packs/day.  Doing very well.  Still trying to quit completely.  Inquiring about Nicorette lozenge.  She does smoke first thing in the morning.  Complains of ear popping.  Wears earplugs at work.  Feels like fluid is in her ears.  No pain.  No fevers.  She does admit to allergy symptoms.  GERD symptoms: Doing much better.  Now using Prilosec only as needed.    Assessment  1. Stress reaction   2. Nicotine dependence, cigarettes, uncomplicated   3. ETD (Eustachian tube dysfunction), bilateral   4. Seasonal allergic rhinitis due to pollen   5. Gastroesophageal reflux disease without esophagitis      Plan   Stress reaction: On Lexapro and much improved.  Continuing recheck in 3 months.  Has Xanax if needed for panic.  Cigarette smoking cessation: Continues to improve.  Education and counseling done.  To start Nicorette.  Allergies with eustachian tube dysfunction: Education given.  Start Zyrtec.  Okay to continue with earplugs at work.  GERD PRN PPI.  Follow up: Return in about 3 months (around 08/02/2018) for mood follow up.  No orders of the defined types were placed in this encounter.  Meds ordered this encounter  Medications  . cetirizine (ZYRTEC) 10 MG  tablet    Sig: Take 1 tablet (10 mg total) by mouth daily.    Dispense:  30 tablet    Refill:  11      I reviewed the patients updated PMH, FH, and SocHx.    Patient Active Problem List   Diagnosis Date Noted  . Trichomonal vaginitis 03/13/2018  . Overactive bladder 03/06/2018  . SUI (stress urinary incontinence, female) 03/06/2018  . Nicotine dependence, cigarettes, uncomplicated 09/15/1599   Current Meds  Medication Sig  . ALPRAZolam (XANAX) 0.5 MG tablet Take 0.5 tablets (0.25 mg total) by mouth daily as needed for anxiety.  . Ascorbic Acid (VITAMIN C) 1000 MG tablet Take 1,000 mg by mouth daily.  Marland Kitchen escitalopram (LEXAPRO) 5 MG tablet Take 1 tablet (5 mg total) by mouth daily.  Marland Kitchen omeprazole (PRILOSEC) 20 MG capsule TAKE 1 CAPSULE BY MOUTH EVERY DAY  . oxybutynin (DITROPAN XL) 10 MG 24 hr tablet Take 1 tablet (10 mg total) by mouth at bedtime.    Allergies: Patient is allergic to sulfa antibiotics. Family history:  Patient family history includes Healthy in her son; Hypertension in her son. She was adopted. Social History   Socioeconomic History  . Marital status: Divorced    Spouse name: Not on file  . Number of children: 2  . Years of education: Not on file  . Highest education level: Not on file  Occupational History  Employer: [employer]  Social Needs  . Financial resource strain: Not on file  . Food insecurity:    Worry: Not on file    Inability: Not on file  . Transportation needs:    Medical: Not on file    Non-medical: Not on file  Tobacco Use  . Smoking status: Current Every Day Smoker    Years: 23.00    Types: Cigarettes  . Smokeless tobacco: Never Used  . Tobacco comment: 1-2 cigs daily as of 02/2018  Substance and Sexual Activity  . Alcohol use: No  . Drug use: No  . Sexual activity: Not Currently    Birth control/protection: Post-menopausal  Lifestyle  . Physical activity:    Days per week: Not on file    Minutes per session:  Not on file  . Stress: Not on file  Relationships  . Social connections:    Talks on phone: Not on file    Gets together: Not on file    Attends religious service: Not on file    Active member of club or organization: Not on file    Attends meetings of clubs or organizations: Not on file    Relationship status: Not on file  Other Topics Concern  . Not on file  Social History Narrative  . Not on file     Review of Systems: Constitutional: Negative for fever malaise or anorexia Cardiovascular: negative for chest pain Respiratory: negative for SOB or persistent cough Gastrointestinal: negative for abdominal pain  Objective  Vitals: BP (!) 102/52   Pulse 63   Temp 97.9 F (36.6 C) (Oral)   Resp 14   Ht 5\' 2"  (1.575 m)   Wt 91 lb 12.8 oz (41.6 kg)   LMP 10/12/2012   SpO2 98%   BMI 16.79 kg/m  General: no acute distress, well appearing, no apparent distress, well groomed Psych:  Alert and oriented x 3, happy appearing.  HEENT: Bilateral TMs with mild opacification of eardrums, no erythema.  External canals are normal bilaterally.    Commons side effects, risks, benefits, and alternatives for medications and treatment plan prescribed today were discussed, and the patient expressed understanding of the given instructions. Patient is instructed to call or message via MyChart if he/she has any questions or concerns regarding our treatment plan. No barriers to understanding were identified. We discussed Red Flag symptoms and signs in detail. Patient expressed understanding regarding what to do in case of urgent or emergency type symptoms.   Medication list was reconciled, printed and provided to the patient in AVS. Patient instructions and summary information was reviewed with the patient as documented in the AVS. This note was prepared with assistance of Dragon voice recognition software. Occasional wrong-word or sound-a-like substitutions may have occurred due to the inherent  limitations of voice recognition software

## 2018-06-27 ENCOUNTER — Other Ambulatory Visit: Payer: Self-pay | Admitting: Family Medicine

## 2018-07-07 ENCOUNTER — Encounter: Payer: Self-pay | Admitting: *Deleted

## 2018-07-07 ENCOUNTER — Ambulatory Visit: Payer: Self-pay | Admitting: *Deleted

## 2018-07-07 NOTE — Telephone Encounter (Signed)
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

## 2018-07-07 NOTE — Telephone Encounter (Signed)
Pt needs letter today. Please advise.

## 2018-07-07 NOTE — Telephone Encounter (Signed)
  Reason for Disposition . [1] Caller requesting NON-URGENT health information AND [2] PCP's office is the best resource  Answer Assessment - Initial Assessment Questions 1. REASON FOR CALL or QUESTION: "What is your reason for calling today?" or "How can I best help you?" or "What question do you have that I can help answer?"     need note for medical mask  Protocols used: INFORMATION ONLY CALL-A-AH

## 2018-07-07 NOTE — Telephone Encounter (Signed)
Letter printed and pt aware ready for pick up

## 2018-07-07 NOTE — Telephone Encounter (Signed)
See note

## 2018-07-26 ENCOUNTER — Other Ambulatory Visit: Payer: Self-pay | Admitting: Family Medicine

## 2018-08-03 ENCOUNTER — Encounter: Payer: Self-pay | Admitting: Family Medicine

## 2018-08-03 ENCOUNTER — Other Ambulatory Visit: Payer: Self-pay

## 2018-08-03 ENCOUNTER — Ambulatory Visit (INDEPENDENT_AMBULATORY_CARE_PROVIDER_SITE_OTHER): Payer: BLUE CROSS/BLUE SHIELD | Admitting: Family Medicine

## 2018-08-03 VITALS — Temp 94.6°F | Wt 97.6 lb

## 2018-08-03 DIAGNOSIS — J0100 Acute maxillary sinusitis, unspecified: Secondary | ICD-10-CM | POA: Diagnosis not present

## 2018-08-03 DIAGNOSIS — F430 Acute stress reaction: Secondary | ICD-10-CM

## 2018-08-03 DIAGNOSIS — H6983 Other specified disorders of Eustachian tube, bilateral: Secondary | ICD-10-CM

## 2018-08-03 DIAGNOSIS — J301 Allergic rhinitis due to pollen: Secondary | ICD-10-CM | POA: Diagnosis not present

## 2018-08-03 MED ORDER — AMOXICILLIN-POT CLAVULANATE 875-125 MG PO TABS: 1.0000 | ORAL_TABLET | Freq: Two times a day (BID) | ORAL | 0 refills | Status: DC

## 2018-08-03 MED ORDER — FLUTICASONE PROPIONATE 50 MCG/ACT NA SUSP: 1.0000 | Freq: Every day | NASAL | 6 refills | Status: DC

## 2018-08-03 NOTE — Progress Notes (Signed)
Linda Pugh, please schedule patient for 6 month f/u for mood

## 2018-08-03 NOTE — Progress Notes (Signed)
TELEPHONE ENCOUNTER   Patient verbally agreed to telephone visit and is aware that copayment and coinsurance may apply. Patient was treated using telemedicine according to accepted telemedicine protocols.  Location of the patient: home Location of provider: [place], [place] office Names of all persons participating in the telemedicine service and role in the encounter: Leamon Arnt, MD Lilli Light, CMA  Subjective  CC:  Chief Complaint  Patient presents with  . Stress    GAD 7 score is 6 today.  . Right Ear pain    Inner ear.Marland Kitchen Popping, ringing, and sore.. She has to wear ear plugs at work.. She states that there is some jaw pain, headache, and pressure.. She has tried Advil    HPI: Linda Pugh is a 48 y.o. female who was telephoned today to address the problems listed above in the chief complaint.  Stress reaction: Stable on Lexapro 5 mg daily.  Continues to handle stress at work better on the medications.  She does admit that she is mildly more anxious due to the COVID-19 pandemic.  She worries that she could catch the illness.  She is being very strict with social distancing and preventive measures.  She does not feel that she needs to increase her medications at this time.  She denies panic symptoms. GAD 7 : Generalized Anxiety Score 08/03/2018  Nervous, Anxious, on Edge 1  Control/stop worrying 1  Worry too much - different things 1  Trouble relaxing 0  Restless 1  Easily annoyed or irritable 1  Afraid - awful might happen 1  Total GAD 7 Score 6  Anxiety Difficulty Not difficult at all    Patient complains of sinus congestion with thick green postnasal drainage and congestion.  She describes right facial pain and pressure and right ear pain.  This is worsened over the last 2 weeks.  She has been on Zyrtec for her allergies.  She denies external ear pain or pain with touching her ear.  She does wear earplugs at work.  She denies cough.  No fevers.   ASSESSMENT/plan: 1. Stress reaction   2. ETD (Eustachian tube dysfunction), bilateral   3. Seasonal allergic rhinitis due to pollen   4. Acute non-recurrent maxillary sinusitis      Stress reaction: Continue Lexapro.  Stable  Acute sinusitis on top of allergies and eustachian tube dysfunction: Further education given.  Start Flonase.  Augmentin.  Follow-up if unimproved.  Monitor for fevers or cough.  Would recommend self-isolation if they develop.  Time spent with the patient (non face-to-face time during this virtual encounter): 12 minutes, spent in obtaining information about her symptoms, reviewing her previous labs, evaluations, and treatments, counseling her about her condition (please see the discussed topics above), and developing a plan to further investigate it; the patient was provided an opportunity to ask questions and all were answered. The patient agreed with the plan and demonstrated an understanding of the instructions.   The patient was advised to call back or seek an in-person evaluation if the symptoms worsen or if the condition fails to improve as anticipated.  Follow up: Return in about 6 months (around 02/03/2019) for mood follow up.  Visit date not found  No orders of the defined types were placed in this encounter.  Meds ordered this encounter  Medications  . amoxicillin-clavulanate (AUGMENTIN) 875-125 MG tablet    Sig: Take 1 tablet by mouth 2 (two) times daily.    Dispense:  14 tablet  Refill:  0  . fluticasone (FLONASE) 50 MCG/ACT nasal spray    Sig: Place 1 spray into both nostrils daily.    Dispense:  16 g    Refill:  6     I reviewed the patients updated PMH, FH, and SocHx.    Patient Active Problem List   Diagnosis Date Noted  . Trichomonal vaginitis 03/13/2018  . Overactive bladder 03/06/2018  . SUI (stress urinary incontinence, female) 03/06/2018  . Nicotine dependence, cigarettes, uncomplicated 53/00/5110   Current Meds  Medication Sig   . ALPRAZolam (XANAX) 0.5 MG tablet Take 0.5 tablets (0.25 mg total) by mouth daily as needed for anxiety.  . Ascorbic Acid (VITAMIN C) 1000 MG tablet Take 1,000 mg by mouth daily.  . cetirizine (ZYRTEC) 10 MG tablet Take 1 tablet (10 mg total) by mouth daily.  Marland Kitchen escitalopram (LEXAPRO) 5 MG tablet TAKE 1 TABLET BY MOUTH EVERY DAY    Allergies: Patient is allergic to sulfa antibiotics. Family History: Patient family history includes Healthy in her son; Hypertension in her son. She was adopted. Social History:  Patient  reports that she has been smoking cigarettes. She has smoked for the past 23.00 years. She has never used smokeless tobacco. She reports that she does not drink alcohol or use drugs.  Review of Systems: Constitutional: Negative for fever malaise or anorexia Cardiovascular: negative for chest pain Respiratory: negative for SOB or persistent cough Gastrointestinal: negative for abdominal pain

## 2018-08-06 ENCOUNTER — Telehealth: Payer: Self-pay | Admitting: Family Medicine

## 2018-08-06 ENCOUNTER — Other Ambulatory Visit: Payer: Self-pay | Admitting: Family Medicine

## 2018-08-06 ENCOUNTER — Other Ambulatory Visit: Payer: BLUE CROSS/BLUE SHIELD

## 2018-08-06 ENCOUNTER — Telehealth: Payer: Self-pay | Admitting: *Deleted

## 2018-08-06 DIAGNOSIS — R6889 Other general symptoms and signs: Secondary | ICD-10-CM | POA: Diagnosis not present

## 2018-08-06 DIAGNOSIS — Z20822 Contact with and (suspected) exposure to covid-19: Secondary | ICD-10-CM

## 2018-08-06 NOTE — Telephone Encounter (Signed)
Called Ms. Calip she is taking the Sudafed and antibiotics.. She reports that she can not tell any improvement and left ear seems to be worsening now. PLease advise

## 2018-08-06 NOTE — Telephone Encounter (Signed)
See note  Copied from [place] [id number]. Topic: General - Other >> Aug 06, 2018  9:36 AM Oneta Rack wrote: Relation to WN:IOEV  Call back number: [phone]   Pharmacy: [id number] - [place], [place] - [address] [place] [address] [phone] (Phone) [phone] (Fax)  Reason for call:  patient  Patient telemedicine  visit with PCP on 08/03/2018, patient states symptoms have not improved, patient experiencing right ear pain ringing and chest congestion. Patient has 3 days left on antibiotics and would like to discuss further additional antibiotics, please advise

## 2018-08-06 NOTE — Telephone Encounter (Signed)
Pt scheduled for COvid-19 testing at [facility] location; 1245 today, per Dr. Tamela Oddi orders. Pt with symptoms; cough, congestion worsening.Testing process reviewed with pt; verbalizes understanding.

## 2018-08-06 NOTE — Telephone Encounter (Signed)
Due to worsening sinus sxs, pt needs covid testing. Please refer to [facility] for covid triage.

## 2018-08-06 NOTE — Telephone Encounter (Signed)
Patient is calling in. States she would like to be referred to an urgent care due to medications not working. Patient scheduled for COVID testing and she is aware. Requesting call back [phone]

## 2018-08-06 NOTE — Telephone Encounter (Signed)
COVID testing

## 2018-08-06 NOTE — Telephone Encounter (Signed)
See note

## 2018-08-06 NOTE — Telephone Encounter (Signed)
Pt aware she will be receiving a call from [facility] about testing.

## 2018-08-06 NOTE — Telephone Encounter (Signed)
Copied from [place] [phone]. Topic: General - Other >> Aug 06, 2018  9:36 AM Oneta Rack wrote: Relation to ZR:VUFC  Call back number: [phone]   Pharmacy: [id number] [place], [place] - [address] [phone] (Phone) [phone] (Fax)  Reason for call:  patient  Patient telemedicine  visit with PCP on 08/03/2018, patient states symptoms have not improved, patient experiencing right ear pain ringing and chest congestion. Patient has 3 days left on antibiotics and would like to discuss further additional antibiotics, please advise >> Aug 06, 2018 10:01 AM Antonieta Iba C wrote: Pt called in to update. She would like a call back on her mobile number instead.   CB: [phone]

## 2018-08-06 NOTE — Telephone Encounter (Signed)
See note  Copied from [place] [id number]. Topic: General - Other >> Aug 06, 2018 10:01 AM Antonieta Iba C wrote: Pt called in to update. She would like a call back on her mobile number instead.   CB: [phone]

## 2018-08-06 NOTE — Telephone Encounter (Signed)
Called pt back she has had testing completed and needs note for work and is awaiting COVID results.

## 2018-08-07 DIAGNOSIS — H6690 Otitis media, unspecified, unspecified ear: Secondary | ICD-10-CM | POA: Diagnosis not present

## 2018-08-07 DIAGNOSIS — J3089 Other allergic rhinitis: Secondary | ICD-10-CM | POA: Diagnosis not present

## 2018-08-07 DIAGNOSIS — H6983 Other specified disorders of Eustachian tube, bilateral: Secondary | ICD-10-CM | POA: Diagnosis not present

## 2018-08-11 ENCOUNTER — Telehealth: Payer: Self-pay | Admitting: Family Medicine

## 2018-08-11 NOTE — Telephone Encounter (Signed)
F.Y.I   I spoke with patient and she wants to inform Dr. Jonni Sanger that she went to Urgent Care on Friday,08/07/18 due to her not feeling better after completing antibiotic.  Patient also was very concerned about her Covid 19 test results from Friday as well.  Patient would like a call back from Dr. Tamela Oddi assistant, as I informed patient that she and Dr. Jonni Sanger were out of the office this afternoon.  Patient said that she just wanted to make them aware and find out what to do now.  I informed pt that I would send message to Dr. Jonni Sanger and to call back if her symptoms worsen or if her temp reaches 100.4 or higher.

## 2018-08-11 NOTE — Telephone Encounter (Signed)
Left message for patient to call office back.

## 2018-08-11 NOTE — Telephone Encounter (Signed)
Copied from [place] [phone]. Topic: General - Other >> Aug 11, 2018 10:09 AM Ivar Drape wrote: Reason for CRM:   Patient would like a call back from the providers Medical Assistant about her Ear Infection.

## 2018-08-11 NOTE — Telephone Encounter (Signed)
See note

## 2018-08-11 NOTE — Telephone Encounter (Signed)
Pt returned call and ask that when you call back to call her home number listed

## 2018-08-12 DIAGNOSIS — J329 Chronic sinusitis, unspecified: Secondary | ICD-10-CM | POA: Diagnosis not present

## 2018-08-12 DIAGNOSIS — B9689 Other specified bacterial agents as the cause of diseases classified elsewhere: Secondary | ICD-10-CM | POA: Diagnosis not present

## 2018-08-12 LAB — NOVEL CORONAVIRUS, NAA: SARS-CoV-2, NAA: NOT DETECTED

## 2018-08-12 NOTE — Telephone Encounter (Signed)
Went to [place], got injection, was told to stop flonase start Afrin, and gave her prednisone.. She states that she is not feeling much better and her temps are up and down with her taking Advil. Wants to know what should she do.

## 2018-08-12 NOTE — Telephone Encounter (Signed)
covid test is negative; can come in for visit or can do virtual visit.

## 2018-08-12 NOTE — Telephone Encounter (Signed)
Pt scheduled for tomorrow

## 2018-08-12 NOTE — Telephone Encounter (Signed)
Please call patient. Please get covid-19 result. I don't see it resulted yet and it should be.  Please find out what urgent care dxd and treated. thanks

## 2018-08-12 NOTE — Progress Notes (Signed)
Negative covid 19 testing.

## 2018-08-13 ENCOUNTER — Ambulatory Visit (INDEPENDENT_AMBULATORY_CARE_PROVIDER_SITE_OTHER): Payer: BLUE CROSS/BLUE SHIELD | Admitting: Family Medicine

## 2018-08-13 ENCOUNTER — Other Ambulatory Visit: Payer: Self-pay

## 2018-08-13 ENCOUNTER — Encounter: Payer: Self-pay | Admitting: Family Medicine

## 2018-08-13 DIAGNOSIS — H6983 Other specified disorders of Eustachian tube, bilateral: Secondary | ICD-10-CM

## 2018-08-13 NOTE — Progress Notes (Signed)
TELEPHONE ENCOUNTER   Patient verbally agreed to telephone visit and is aware that copayment and coinsurance may apply. Patient was treated using telemedicine according to accepted telemedicine protocols.  Location of the patient: home Location of provider: [place], [place] office Names of all persons participating in the telemedicine service and role in the encounter: Leamon Arnt, MD Lilli Light, CMA    Subjective  CC:  Chief Complaint  Patient presents with  . Otitis Media    Went to Urgent Care 08/07/18, reports minimal improvement after receiving injection, prednisone, and switching to Afrin.. She reports that yesterday Dr. Raliegh Ip from urgent care called told her added Azel    HPI: Linda Pugh is a 48 y.o. female who was telephoned today to address the problems listed above in the chief complaint.  First with ear complaints back in February: treated for ETD  Then sinusitis sxs last week with ear pressure: treated with augmentin  Then worsening sxs: negative covid testing and UC visit: pt reports ear infection and treated with augmentin extension and pred. On allergy meds. She reports she is no better.   ASSESSMENT: 1. ETD (Eustachian tube dysfunction), bilateral      Rec in person office visit to help sort out. May need ENT.  Time spent with the patient (non face-to-face time during this virtual encounter): 5 minutes, spent in obtaining information about her symptoms, reviewing her previous labs, evaluations, and treatments, counseling her about her condition (please see the discussed topics above), and developing a plan to further investigate it; the patient was provided an opportunity to ask questions and all were answered. The patient agreed with the plan and demonstrated an understanding of the instructions.   The patient was advised to call back or seek an in-person evaluation if the symptoms worsen or if the condition fails to improve as anticipated.   Follow up: No follow-ups on file.  09/07/2018  No orders of the defined types were placed in this encounter.  No orders of the defined types were placed in this encounter.    I reviewed the patients updated PMH, FH, and SocHx.    Patient Active Problem List   Diagnosis Date Noted  . Trichomonal vaginitis 03/13/2018  . Overactive bladder 03/06/2018  . SUI (stress urinary incontinence, female) 03/06/2018  . Nicotine dependence, cigarettes, uncomplicated 89/21/1942   Current Meds  Medication Sig  . ALPRAZolam (XANAX) 0.5 MG tablet Take 0.5 tablets (0.25 mg total) by mouth daily as needed for anxiety.  Marland Kitchen amoxicillin-clavulanate (AUGMENTIN) 875-125 MG tablet Take 1 tablet by mouth 2 (two) times daily.  . Ascorbic Acid (VITAMIN C) 1000 MG tablet Take 1,000 mg by mouth daily.  Marland Kitchen azelastine (ASTELIN) 0.1 % nasal spray Place into both nostrils 2 (two) times daily. Use in each nostril as directed  . escitalopram (LEXAPRO) 5 MG tablet TAKE 1 TABLET BY MOUTH EVERY DAY  . Loratadine 10 MG CAPS Take by mouth.    Allergies: Patient is allergic to sulfa antibiotics. Family History: Patient family history includes Healthy in her son; Hypertension in her son. She was adopted. Social History:  Patient  reports that she has been smoking cigarettes. She has smoked for the past 23.00 years. She has never used smokeless tobacco. She reports that she does not drink alcohol or use drugs.  Review of Systems: Constitutional: Negative for fever malaise or anorexia Cardiovascular: negative for chest pain Respiratory: negative for SOB or persistent cough Gastrointestinal: negative for abdominal pain  [id number] physician/qualified health professional telephone evaluation 5 to 10 minutes [id number] physician/qualified help functional [place] evaluation for 11 to 20 minutes [id number] physician/qualify he will professional telephone evaluation for 21 to 30 minutes

## 2018-08-17 ENCOUNTER — Encounter: Payer: Self-pay | Admitting: Family Medicine

## 2018-08-17 ENCOUNTER — Other Ambulatory Visit: Payer: Self-pay

## 2018-08-17 ENCOUNTER — Ambulatory Visit: Payer: BLUE CROSS/BLUE SHIELD | Admitting: Family Medicine

## 2018-08-17 VITALS — BP 104/62 | HR 63 | Temp 97.6°F | Resp 16 | Ht 62.0 in | Wt 96.8 lb

## 2018-08-17 DIAGNOSIS — H9203 Otalgia, bilateral: Secondary | ICD-10-CM

## 2018-08-17 DIAGNOSIS — R5383 Other fatigue: Secondary | ICD-10-CM

## 2018-08-17 DIAGNOSIS — H6983 Other specified disorders of Eustachian tube, bilateral: Secondary | ICD-10-CM | POA: Insufficient documentation

## 2018-08-17 DIAGNOSIS — Z8709 Personal history of other diseases of the respiratory system: Secondary | ICD-10-CM

## 2018-08-17 DIAGNOSIS — J342 Deviated nasal septum: Secondary | ICD-10-CM | POA: Diagnosis not present

## 2018-08-17 DIAGNOSIS — J301 Allergic rhinitis due to pollen: Secondary | ICD-10-CM

## 2018-08-17 DIAGNOSIS — J321 Chronic frontal sinusitis: Secondary | ICD-10-CM | POA: Diagnosis not present

## 2018-08-17 DIAGNOSIS — R1013 Epigastric pain: Secondary | ICD-10-CM | POA: Diagnosis not present

## 2018-08-17 DIAGNOSIS — J3489 Other specified disorders of nose and nasal sinuses: Secondary | ICD-10-CM | POA: Diagnosis not present

## 2018-08-17 DIAGNOSIS — H9313 Tinnitus, bilateral: Secondary | ICD-10-CM | POA: Diagnosis not present

## 2018-08-17 DIAGNOSIS — Q359 Cleft palate, unspecified: Secondary | ICD-10-CM | POA: Insufficient documentation

## 2018-08-17 LAB — COMPREHENSIVE METABOLIC PANEL
ALT: 20 U/L (ref 0–35)
AST: 18 U/L (ref 0–37)
Albumin: 4 g/dL (ref 3.5–5.2)
Alkaline Phosphatase: 61 U/L (ref 39–117)
BUN: 15 mg/dL (ref 6–23)
CO2: 26 mEq/L (ref 19–32)
Calcium: 9.4 mg/dL (ref 8.4–10.5)
Chloride: 104 mEq/L (ref 96–112)
Creatinine, Ser: 0.82 mg/dL (ref 0.40–1.20)
GFR: 74.18 mL/min (ref >60.00)
Glucose, Bld: 88 mg/dL (ref 70–99)
Potassium: 4 mEq/L (ref 3.5–5.1)
Sodium: 138 mEq/L (ref 135–145)
Total Bilirubin: 0.7 mg/dL (ref 0.2–1.2)
Total Protein: 6.7 g/dL (ref 6.0–8.3)

## 2018-08-17 LAB — CBC WITH DIFFERENTIAL/PLATELET
Basophils Absolute: 0 10*3/uL (ref 0.0–0.1)
Basophils Relative: 0.5 % (ref 0.0–3.0)
Eosinophils Absolute: 0.2 10*3/uL (ref 0.0–0.7)
Eosinophils Relative: 2 % (ref 0.0–5.0)
HCT: 45.9 % (ref 36.0–46.0)
Hemoglobin: 16 g/dL — ABNORMAL HIGH (ref 12.0–15.0)
Lymphocytes Relative: 27 % (ref 12.0–46.0)
Lymphs Abs: 2.3 10*3/uL (ref 0.7–4.0)
MCHC: 34.9 g/dL (ref 30.0–36.0)
MCV: 96 fl (ref 78.0–100.0)
Monocytes Absolute: 0.7 10*3/uL (ref 0.1–1.0)
Monocytes Relative: 7.9 % (ref 3.0–12.0)
Neutro Abs: 5.3 10*3/uL (ref 1.4–7.7)
Neutrophils Relative %: 62.6 % (ref 43.0–77.0)
Platelets: 192 10*3/uL (ref 150.0–400.0)
RBC: 4.78 Mil/uL (ref 3.87–5.11)
RDW: 12.6 % (ref 11.5–15.5)
WBC: 8.5 10*3/uL (ref 4.0–10.5)

## 2018-08-17 LAB — SEDIMENTATION RATE: Sed Rate: 13 mm/hr (ref 0–20)

## 2018-08-17 LAB — TSH: TSH: 0.94 u[IU]/mL (ref 0.35–4.50)

## 2018-08-17 MED ORDER — NEOMYCIN-POLYMYXIN-HC 3.5-10000-1 OT SOLN: 3.0000 [drp] | Freq: Three times a day (TID) | OTIC | 0 refills | Status: DC

## 2018-08-17 NOTE — Progress Notes (Signed)
Please call patient: I have reviewed his/her lab results.  Please let her know that all of her lab work is completely normal.

## 2018-08-17 NOTE — Progress Notes (Signed)
Subjective  CC:  Chief Complaint  Patient presents with  . Otitis Media/Sinusitis    No inprovement, off balance, Headache, facial pain, no appetite, and fatigue.. Taking Excedrin Headache with minimal relief    HPI: Linda Pugh is a 48 y.o. female who presents to the office today to address the problems listed above in the chief complaint.  3-4 month history of ear pain with recent sinus and Uri sxs: treated with steroids and abx x 2 (once for sinusitis and then Urgent care dxd "otitis media") and antihistamines. Pt continues to complain of sxs. Feels terrible. Still with face and ear pressure. No fevers. covid 19 negative testing last week. No cough or sob. Also c/o epigastric pain. Malaise. No n/v/d or abdominal pain. No chest pain. Vague historian.    Assessment  1. Acute otalgia, bilateral   2. ETD (Eustachian tube dysfunction), bilateral   3. Seasonal allergic rhinitis due to pollen   4. History of sinusitis      Plan   Unclear etiology:  Refer to ENt to evaluated ear pain and sinus sxs unresponsive to medications. Pt is not working due to the symptoms so we need clarification so we can return her to work.   Add corticosporin ear drops due to right ear ttp and EAC swelling on ecam.   Follow up:   09/07/2018  Orders Placed This Encounter  Procedures  . Ambulatory referral to ENT   No orders of the defined types were placed in this encounter.     I reviewed the patients updated PMH, FH, and SocHx.    Patient Active Problem List   Diagnosis Date Noted  . Trichomonal vaginitis 03/13/2018  . Overactive bladder 03/06/2018  . SUI (stress urinary incontinence, female) 03/06/2018  . Nicotine dependence, cigarettes, uncomplicated 82/50/5397   Current Meds  Medication Sig  . ALPRAZolam (XANAX) 0.5 MG tablet Take 0.5 tablets (0.25 mg total) by mouth daily as needed for anxiety.  . Ascorbic Acid (VITAMIN C) 1000 MG tablet Take 1,000 mg by mouth daily.  Marland Kitchen azelastine  (ASTELIN) 0.1 % nasal spray Place into both nostrils 2 (two) times daily. Use in each nostril as directed  . escitalopram (LEXAPRO) 5 MG tablet TAKE 1 TABLET BY MOUTH EVERY DAY  . fluticasone (FLONASE) 50 MCG/ACT nasal spray Place 1 spray into both nostrils daily.  . Loratadine 10 MG CAPS Take by mouth.  Marland Kitchen omeprazole (PRILOSEC) 20 MG capsule TAKE 1 CAPSULE BY MOUTH EVERY DAY    Allergies: Patient is allergic to sulfa antibiotics. Family History: Patient family history includes Healthy in her son; Hypertension in her son. She was adopted. Social History:  Patient  reports that she has been smoking cigarettes. She has smoked for the past 23.00 years. She has never used smokeless tobacco. She reports that she does not drink alcohol or use drugs.  Review of Systems: Constitutional: Negative for fever malaise or anorexia Cardiovascular: negative for chest pain Respiratory: negative for SOB or persistent cough Gastrointestinal: negative for abdominal pain  Objective  Vitals: BP 104/62   Pulse 63   Temp 97.6 F (36.4 C) (Oral)   Resp 16   Ht 5\' 2"  (1.575 m)   Wt 96 lb 12.8 oz (43.9 kg)   LMP 10/12/2012   SpO2 97%   BMI 17.70 kg/m  General: no acute respiratory distress , A&Ox3 HEENT: PEERL, conjunctiva normal, Oropharynx moist,neck is supple, Left Tm with opacification and retraction w/o erythema, right EAC with redness , ttp  and swelling. Tm is not red.  Cardiovascular:  RRR without murmur or gallop.  Respiratory:  Good breath sounds bilaterally, CTAB with normal respiratory effort Skin:  Warm, no rashes     Commons side effects, risks, benefits, and alternatives for medications and treatment plan prescribed today were discussed, and the patient expressed understanding of the given instructions. Patient is instructed to call or message via MyChart if he/she has any questions or concerns regarding our treatment plan. No barriers to understanding were identified. We discussed [place]  [place] symptoms and signs in detail. Patient expressed understanding regarding what to do in case of urgent or emergency type symptoms.   Medication list was reconciled, printed and provided to the patient in AVS. Patient instructions and summary information was reviewed with the patient as documented in the AVS. This note was prepared with assistance of Dragon voice recognition software. Occasional wrong-word or sound-a-like substitutions may have occurred due to the inherent limitations of voice recognition software

## 2018-08-17 NOTE — Patient Instructions (Addendum)
We will call you with information regarding your referral appointment. ENT doctor. If you do not hear from [place] within the next 2 weeks, please let me know. It can take 1-2 weeks to get appointments set up with the specialists.   Please continue taking prilosec daily to help your stomach. Use the ear drops on the right 2-3x/day.

## 2018-08-18 ENCOUNTER — Telehealth: Payer: Self-pay | Admitting: Family Medicine

## 2018-08-18 NOTE — Telephone Encounter (Signed)
See note

## 2018-08-18 NOTE — Telephone Encounter (Signed)
Pt calling back for lab results

## 2018-08-18 NOTE — Telephone Encounter (Signed)
See note  Copied from [place] [phone]. Topic: General - Inquiry >> Aug 18, 2018 12:08 PM Berneta Levins wrote: Reason for CRM:   Pt wants to know if lab results are back from yesterday yet and notify office to call home number with results.

## 2018-08-19 ENCOUNTER — Telehealth: Payer: Self-pay | Admitting: Family Medicine

## 2018-08-19 NOTE — Telephone Encounter (Signed)
Copied from [place] [phone]. Topic: General - Other >> Aug 19, 2018  1:30 PM Leward Quan A wrote: Reason for CRM: Patient request a call back with her lab results please at Ph# [phone]

## 2018-08-19 NOTE — Telephone Encounter (Signed)
Patient would like to speak with PCP or nurse to go over lab work Patient call back # [phone]

## 2018-08-19 NOTE — Telephone Encounter (Signed)
See note

## 2018-08-19 NOTE — Telephone Encounter (Signed)
See result note for further documentation.

## 2018-08-20 ENCOUNTER — Telehealth: Payer: Self-pay | Admitting: Family Medicine

## 2018-08-20 NOTE — Telephone Encounter (Signed)
See note

## 2018-08-20 NOTE — Telephone Encounter (Signed)
Copied from [place] [phone]. Topic: General - Other >> Aug 20, 2018  8:54 AM Pauline Good wrote: Reason for CRM: pt need a return to work note to go back to work today. Pt stated she will pick it up this morning. Please call pt.

## 2018-08-20 NOTE — Telephone Encounter (Signed)
Patient is coming in today to pick up the letter that is in Epic so she can go back to work.

## 2018-08-20 NOTE — Telephone Encounter (Signed)
Note is ready for pickup at the front desk.

## 2018-08-20 NOTE — Telephone Encounter (Signed)
Please call her at [phone] when the letter is ready.

## 2018-08-21 ENCOUNTER — Encounter: Payer: Self-pay | Admitting: *Deleted

## 2018-08-21 NOTE — Telephone Encounter (Signed)
Pt notified of results

## 2018-08-21 NOTE — Telephone Encounter (Signed)
Awaiting return call for fax number to send letter  Spoke with pt gave results, she is needing letter for 5/18 - 5/28 (ok to write per Dr. Jonni Sanger)

## 2018-08-21 NOTE — Telephone Encounter (Signed)
Patient is requesting a call back at [phone] to discuss the work note needing some change. Please advise.

## 2018-08-21 NOTE — Telephone Encounter (Signed)
See note

## 2018-08-24 NOTE — Telephone Encounter (Signed)
Called pt again to get fax number, no answer, LMOVM

## 2018-09-07 ENCOUNTER — Ambulatory Visit: Payer: BLUE CROSS/BLUE SHIELD | Admitting: Family Medicine

## 2018-09-15 ENCOUNTER — Encounter: Payer: Self-pay | Admitting: Family Medicine

## 2018-09-15 ENCOUNTER — Ambulatory Visit (INDEPENDENT_AMBULATORY_CARE_PROVIDER_SITE_OTHER): Payer: BC Managed Care – PPO | Admitting: Family Medicine

## 2018-09-15 DIAGNOSIS — F430 Acute stress reaction: Secondary | ICD-10-CM | POA: Diagnosis not present

## 2018-09-15 DIAGNOSIS — J301 Allergic rhinitis due to pollen: Secondary | ICD-10-CM

## 2018-09-15 DIAGNOSIS — F17210 Nicotine dependence, cigarettes, uncomplicated: Secondary | ICD-10-CM | POA: Diagnosis not present

## 2018-09-15 DIAGNOSIS — H6983 Other specified disorders of Eustachian tube, bilateral: Secondary | ICD-10-CM

## 2018-09-15 NOTE — Progress Notes (Signed)
TELEPHONE ENCOUNTER   Patient verbally agreed to telephone visit and is aware that copayment and coinsurance may apply. Patient was treated using telemedicine according to accepted telemedicine protocols.  Location of the patient: home Location of provider: [place], [facility] office Names of all persons participating in the telemedicine service and role in the encounter: Leamon Arnt, MD Lilli Light, CMA and patient   Subjective  CC:  Chief Complaint  Patient presents with  . otalgia, bilateral    She reports that she is doing well and everything has resolved. She did get to see ENT  . Allergies    Taking Loratidine daily, wants to know if she needs to continue taking it  . Headache    She reports that she is having headaches every other day.. She takes Advil Migraine with relief     HPI: Linda Pugh is a 48 y.o. female who was telephoned today to address the problems listed above in the chief complaint.  Reviewed ENT notes and CT scan findings: dx: ETD confirmed. Neg CT. Added loratidine.  F/u:  Now doing fine. No concerns. No ear pain or pressure sensation  Working on quitting smoking again. Down to a few cigs per day; mainly for anxiety.  ASSESSMENT: 1. ETD (Eustachian tube dysfunction), bilateral   2. Seasonal allergic rhinitis due to pollen   3. Nicotine dependence, cigarettes, uncomplicated   4. Stress reaction      ETD and AR:  Improved. Continue same meds. rec f/u with ENT but pt declines. He wanted an auditory screening done.   Smoking cessation counseling done.   Continue lexapro for stress mgt.   Time spent with the patient (non face-to-face time during this virtual encounter): 15 minutes, spent in obtaining information about her symptoms, reviewing her previous labs, evaluations, and treatments, counseling her about her condition (please see the discussed topics above), and developing a plan to further investigate it; the patient was provided an  opportunity to ask questions and all were answered. The patient agreed with the plan and demonstrated an understanding of the instructions.   The patient was advised to call back or seek an in-person evaluation if the symptoms worsen or if the condition fails to improve as anticipated.  Follow up: jan 2021 for CPE  Visit date not found  No orders of the defined types were placed in this encounter.  No orders of the defined types were placed in this encounter.    I reviewed the patients updated PMH, FH, and SocHx.    Patient Active Problem List   Diagnosis Date Noted  . Stress reaction 09/15/2018  . Cleft palate 08/17/2018  . Dysfunction of both eustachian tubes 08/17/2018  . Trichomonal vaginitis 03/13/2018  . Overactive bladder 03/06/2018  . SUI (stress urinary incontinence, female) 03/06/2018  . Nicotine dependence, cigarettes, uncomplicated 44/05/4740   Current Meds  Medication Sig  . ALPRAZolam (XANAX) 0.5 MG tablet Take 0.5 tablets (0.25 mg total) by mouth daily as needed for anxiety.  . Ascorbic Acid (VITAMIN C) 1000 MG tablet Take 1,000 mg by mouth daily.  Marland Kitchen azelastine (ASTELIN) 0.1 % nasal spray Place into both nostrils 2 (two) times daily. Use in each nostril as directed  . escitalopram (LEXAPRO) 5 MG tablet TAKE 1 TABLET BY MOUTH EVERY DAY  . Loratadine 10 MG CAPS Take by mouth.  Marland Kitchen omeprazole (PRILOSEC) 20 MG capsule TAKE 1 CAPSULE BY MOUTH EVERY DAY    Allergies: Patient is allergic to sulfa  antibiotics. Family History: Patient family history includes Healthy in her son; Hypertension in her son. She was adopted. Social History:  Patient  reports that she has been smoking cigarettes. She has smoked for the past 23.00 years. She has never used smokeless tobacco. She reports that she does not drink alcohol or use drugs.  Review of Systems: Constitutional: Negative for fever malaise or anorexia Cardiovascular: negative for chest pain Respiratory: negative for SOB  or persistent cough Gastrointestinal: negative for abdominal pain

## 2018-12-30 ENCOUNTER — Other Ambulatory Visit: Payer: Self-pay

## 2018-12-30 DIAGNOSIS — Z20822 Contact with and (suspected) exposure to covid-19: Secondary | ICD-10-CM

## 2018-12-31 LAB — NOVEL CORONAVIRUS, NAA: SARS-CoV-2, NAA: NOT DETECTED

## 2019-06-04 IMAGING — MG DIGITAL SCREENING BILATERAL MAMMOGRAM WITH TOMO AND CAD
8 series · 9 of 24 positions shown · non-contrast
Comparison: Previous exam(s).

CLINICAL DATA: Screening.

EXAM:
DIGITAL SCREENING BILATERAL MAMMOGRAM WITH TOMO AND CAD

[R MLO synth-2D]
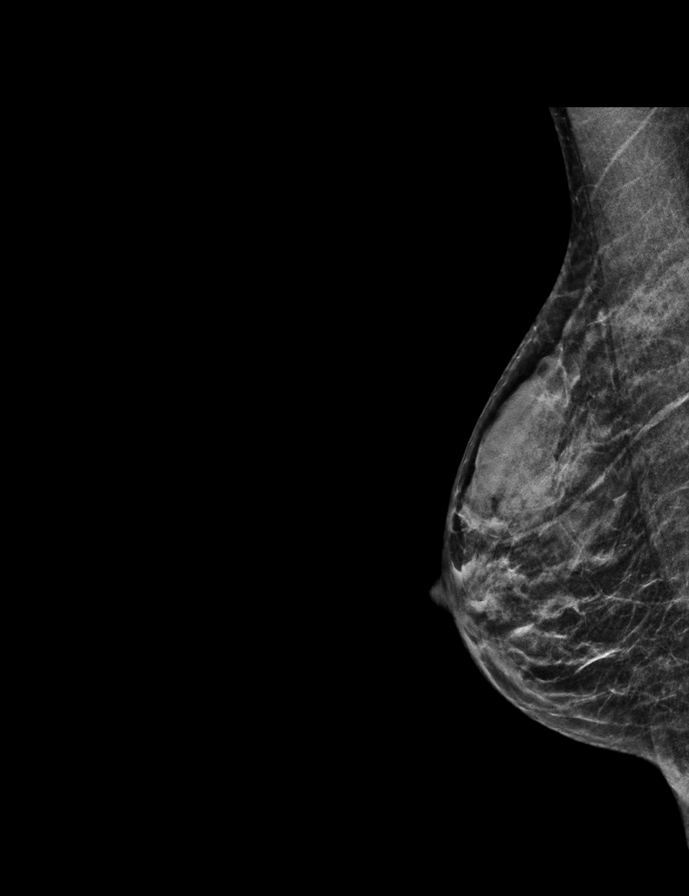

[R CC synth-2D]
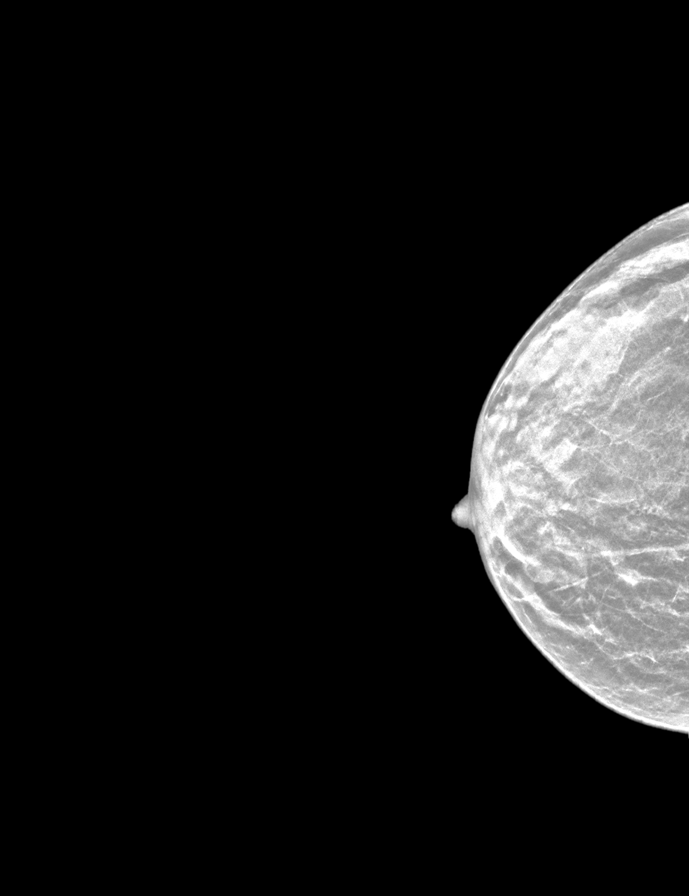

[L CC synth-2D]
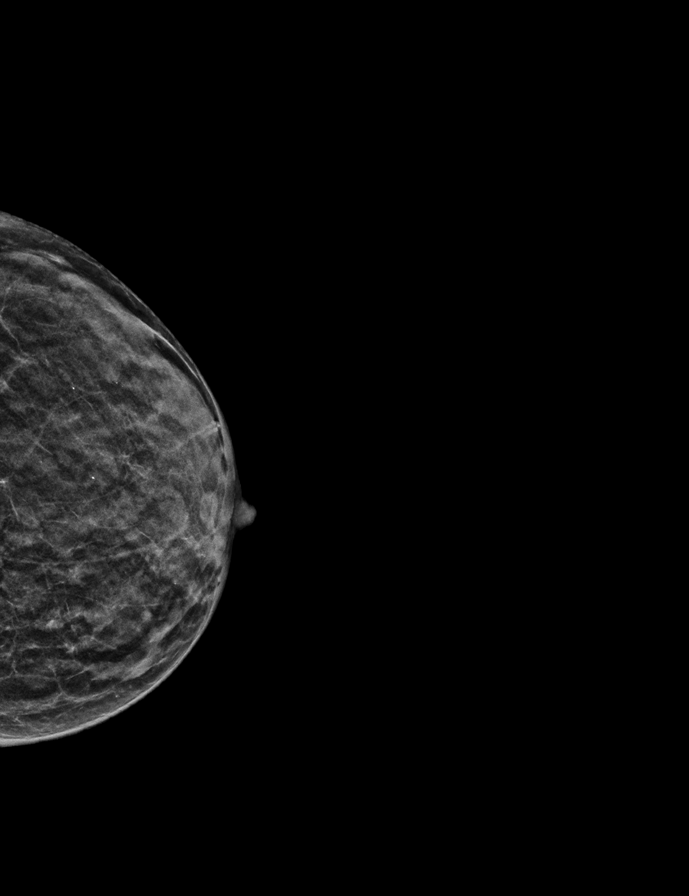

[L MLO synth-2D]
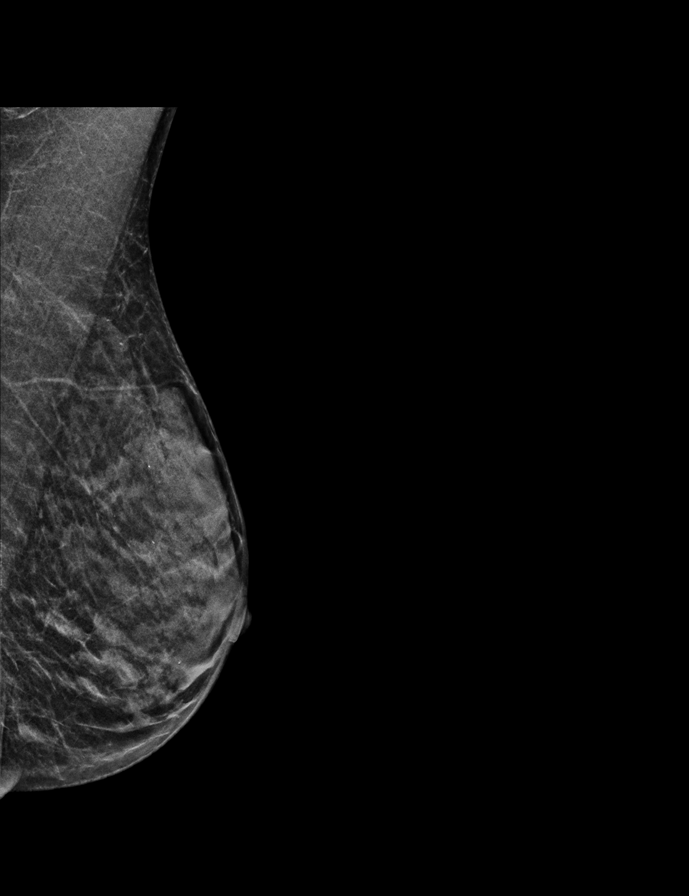

[R MLO tomo · 2 of 33 frames shown]
[frame 11/33]
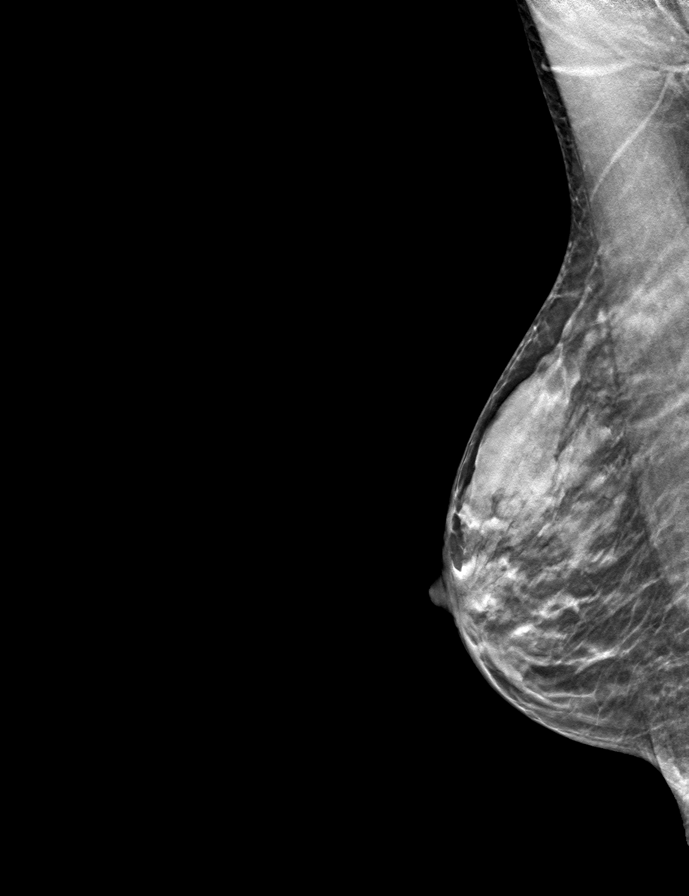
[frame 17/33]
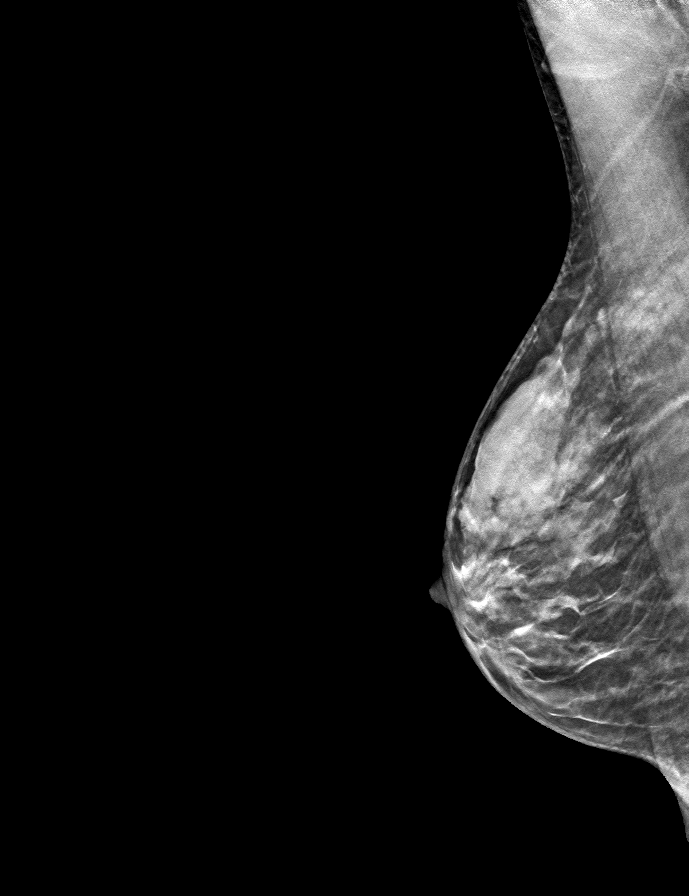

[L MLO tomo · tomo slice 16/31.0]
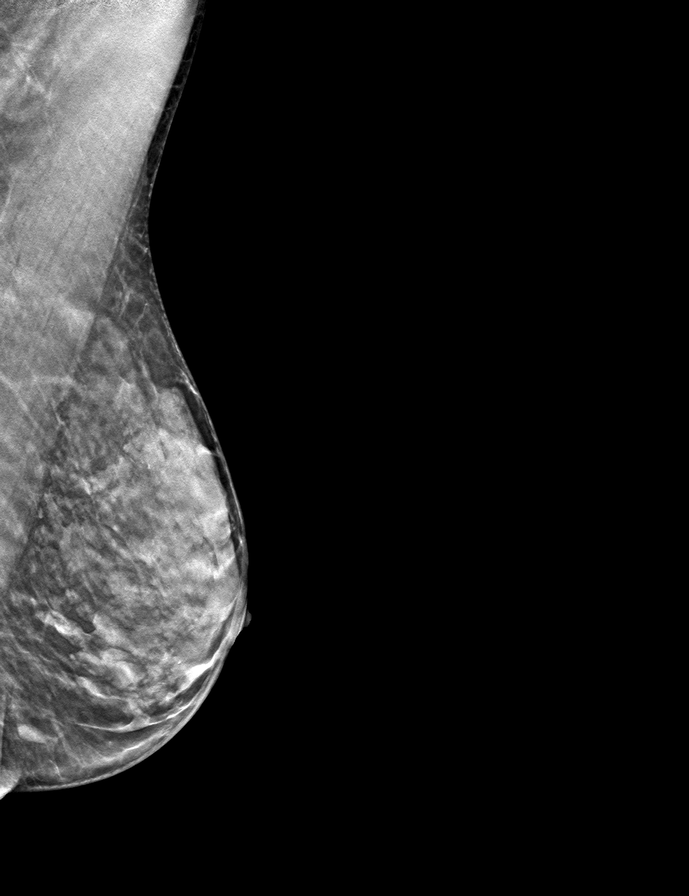

[L CC tomo · tomo slice 14/27.0]
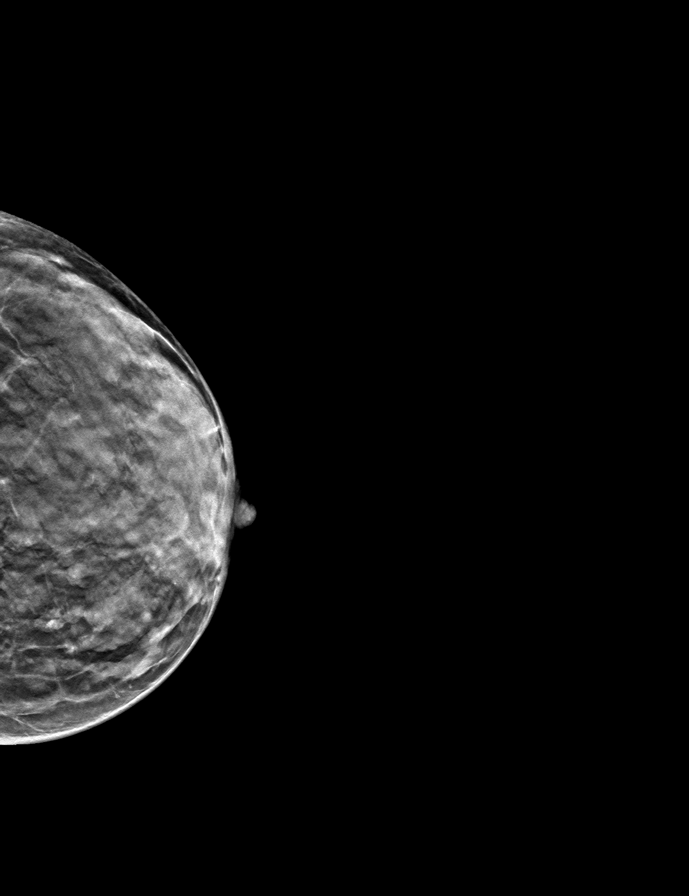

[R CC tomo · tomo slice 15/29.0]
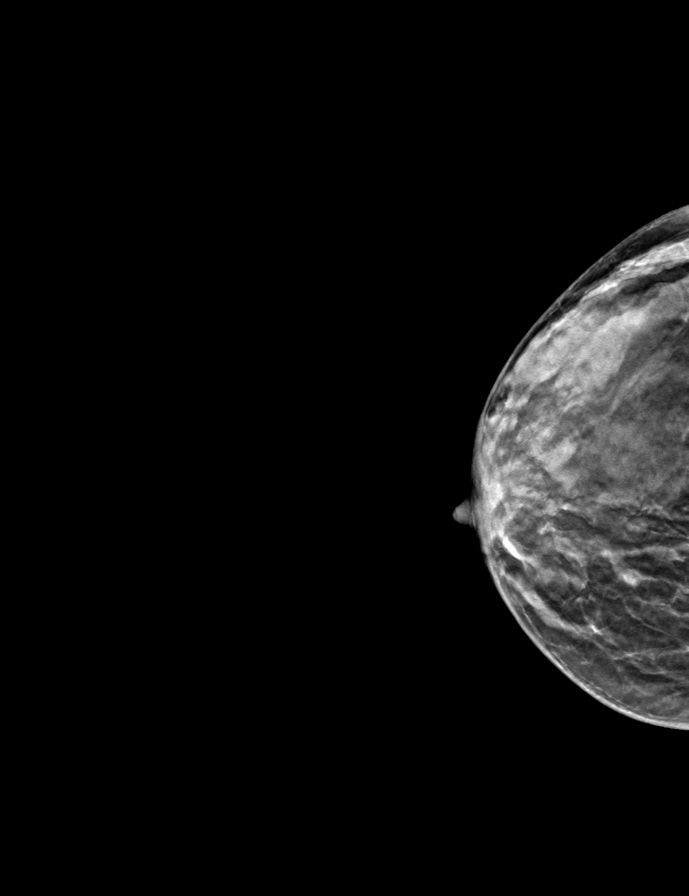

[9 of 24 positions shown; findings below may reference images not displayed]

ACR Breast Density Category d: The breast tissue is extremely dense,
which lowers the sensitivity of mammography
FINDINGS: There are no findings suspicious for malignancy. Images were
processed with CAD.
IMPRESSION: No mammographic evidence of malignancy. A result letter of this
screening mammogram will be mailed directly to the patient.

RECOMMENDATION:
Screening mammogram in one year. (Code:WO-0-ZI0)

BI-RADS CATEGORY  1: Negative.

## 2021-07-05 ENCOUNTER — Emergency Department (HOSPITAL_COMMUNITY)
Admission: EM | Admit: 2021-07-05 | Discharge: 2021-07-05 | Disposition: A | Discharge status: Home / Self Care | Payer: Medicaid Other | Attending: Emergency Medicine | Admitting: Emergency Medicine

## 2021-07-05 ENCOUNTER — Ambulatory Visit (HOSPITAL_COMMUNITY)
Admission: EM | Admit: 2021-07-05 | Discharge: 2021-07-05 | Disposition: A | Discharge status: Home / Self Care | Payer: No Payment, Other | Attending: Family | Admitting: Family

## 2021-07-05 ENCOUNTER — Encounter (HOSPITAL_COMMUNITY): Payer: Self-pay

## 2021-07-05 ENCOUNTER — Other Ambulatory Visit: Payer: Self-pay

## 2021-07-05 DIAGNOSIS — F419 Anxiety disorder, unspecified: Secondary | ICD-10-CM | POA: Diagnosis present

## 2021-07-05 DIAGNOSIS — F329 Major depressive disorder, single episode, unspecified: Secondary | ICD-10-CM | POA: Diagnosis not present

## 2021-07-05 DIAGNOSIS — F4322 Adjustment disorder with anxiety: Secondary | ICD-10-CM | POA: Insufficient documentation

## 2021-07-05 DIAGNOSIS — Z79899 Other long term (current) drug therapy: Secondary | ICD-10-CM | POA: Insufficient documentation

## 2021-07-05 DIAGNOSIS — R451 Restlessness and agitation: Secondary | ICD-10-CM | POA: Insufficient documentation

## 2021-07-05 LAB — CBC
HCT: 45.3 % (ref 36.0–46.0)
Hemoglobin: 15.9 g/dL — ABNORMAL HIGH (ref 12.0–15.0)
MCH: 33.4 pg (ref 26.0–34.0)
MCHC: 35.1 g/dL (ref 30.0–36.0)
MCV: 95.2 fL (ref 80.0–100.0)
Platelets: 179 10*3/uL (ref 150–400)
RBC: 4.76 MIL/uL (ref 3.87–5.11)
RDW: 12.8 % (ref 11.5–15.5)
WBC: 7.6 10*3/uL (ref 4.0–10.5)
nRBC: 0 % (ref 0.0–0.2)

## 2021-07-05 LAB — RAPID URINE DRUG SCREEN, HOSP PERFORMED
Amphetamines: NOT DETECTED
Barbiturates: NOT DETECTED
Benzodiazepines: NOT DETECTED
Cocaine: NOT DETECTED
Opiates: NOT DETECTED
Tetrahydrocannabinol: NOT DETECTED

## 2021-07-05 LAB — COMPREHENSIVE METABOLIC PANEL
ALT: 32 U/L (ref 0–44)
AST: 25 U/L (ref 15–41)
Albumin: 4 g/dL (ref 3.5–5.0)
Alkaline Phosphatase: 54 U/L (ref 38–126)
Anion gap: 6 (ref 5–15)
BUN: 16 mg/dL (ref 6–20)
CO2: 26 mmol/L (ref 22–32)
Calcium: 9.4 mg/dL (ref 8.9–10.3)
Chloride: 106 mmol/L (ref 98–111)
Creatinine, Ser: 0.7 mg/dL (ref 0.44–1.00)
GFR, Estimated: >60 mL/min (ref >60)
Glucose, Bld: 100 mg/dL — ABNORMAL HIGH (ref 70–99)
Potassium: 4.3 mmol/L (ref 3.5–5.1)
Sodium: 138 mmol/L (ref 135–145)
Total Bilirubin: 0.7 mg/dL (ref 0.3–1.2)
Total Protein: 6.6 g/dL (ref 6.5–8.1)

## 2021-07-05 LAB — SALICYLATE LEVEL: Salicylate Lvl: <7 mg/dL — ABNORMAL LOW (ref 7.0–30.0)

## 2021-07-05 LAB — ACETAMINOPHEN LEVEL: Acetaminophen (Tylenol), Serum: <10 ug/mL — ABNORMAL LOW (ref 10–30)

## 2021-07-05 LAB — ETHANOL: Alcohol, Ethyl (B): <10 mg/dL (ref <10)

## 2021-07-05 MED ORDER — TRAZODONE HCL 50 MG PO TABS: 50.0000 mg | ORAL_TABLET | Freq: Every day | ORAL | 0 refills | Status: AC

## 2021-07-05 NOTE — ED Provider Notes (Signed)
?[facility] DEPT ?Provider Note ? ? ?CSN: [id number] ?Arrival date & time: 07/05/21  4627 ? ?  ? ?History ? ?Chief Complaint  ?Patient presents with  ? Anxiety  ? Depression  ? ? ?Linda Pugh is a 51 y.o. female. ? ?51 year old female who presents with worsening depression.  Patient has been on Xanax for several weeks.  No reported suicidal or homicidal ideations.  Patient lives with her mother.  She has been eating and drinking appropriately.  Has not experienced any depression.  Mother states that symptoms are worse in the morning and gets better after she takes Xanax.  Mother is requesting something more long-acting to manage her anxiety.  Is unsure of what is driving it.  She has no psychiatric follow-up ? ? ?  ? ?Home Medications ?Prior to Admission medications   ?Medication Sig Start Date End Date Taking? Authorizing Provider  ?ALPRAZolam (XANAX) 0.5 MG tablet Take 0.5 tablets (0.25 mg total) by mouth daily as needed for anxiety. 04/03/18   Leamon Arnt, MD  ?Ascorbic Acid (VITAMIN C) 1000 MG tablet Take 1,000 mg by mouth daily.    [provider]  ?azelastine (ASTELIN) 0.1 % nasal spray Place into both nostrils 2 (two) times daily. Use in each nostril as directed    [provider]  ?escitalopram (LEXAPRO) 5 MG tablet TAKE 1 TABLET BY MOUTH EVERY DAY 06/29/18   Leamon Arnt, MD  ?Loratadine 10 MG CAPS Take by mouth.    [provider]  ?omeprazole (PRILOSEC) 20 MG capsule TAKE 1 CAPSULE BY MOUTH EVERY DAY 07/27/18   Leamon Arnt, MD  ?   ? ?Allergies    ?Sulfa antibiotics   ? ?Review of Systems   ?Review of Systems  ?All other systems reviewed and are negative. ? ?Physical Exam ?Updated Vital Signs ?BP 124/64   Pulse 84   Temp (!) 97.5 ?F (36.4 ?C) (Oral)   Resp 14   Ht 1.575 m ('5\' 2"'$ )   Wt 39 kg   LMP 10/12/2012   SpO2 100%   BMI 15.73 kg/m?  ?Physical Exam ?Vitals and nursing note reviewed.  ?Constitutional:   ?   General: She is not  in acute distress. ?   Appearance: Normal appearance. She is well-developed. She is not toxic-appearing.  ?HENT:  ?   Head: Normocephalic and atraumatic.  ?Eyes:  ?   General: Lids are normal.  ?   Conjunctiva/sclera: Conjunctivae normal.  ?   Pupils: Pupils are equal, round, and reactive to light.  ?Neck:  ?   Thyroid: No thyroid mass.  ?   Trachea: No tracheal deviation.  ?Cardiovascular:  ?   Rate and Rhythm: Normal rate and regular rhythm.  ?   Heart sounds: Normal heart sounds. No murmur heard. ?  No gallop.  ?Pulmonary:  ?   Effort: Pulmonary effort is normal. No respiratory distress.  ?   Breath sounds: Normal breath sounds. No stridor. No decreased breath sounds, wheezing, rhonchi or rales.  ?Abdominal:  ?   General: There is no distension.  ?   Palpations: Abdomen is soft.  ?   Tenderness: There is no abdominal tenderness. There is no rebound.  ?Musculoskeletal:     ?   General: No tenderness. Normal range of motion.  ?   Cervical back: Normal range of motion and neck supple.  ?Skin: ?   General: Skin is warm and dry.  ?   Findings: No abrasion or  rash.  ?Neurological:  ?   Mental Status: She is alert and oriented to person, place, and time. Mental status is at baseline.  ?   GCS: GCS eye subscore is 4. GCS verbal subscore is 5. GCS motor subscore is 6.  ?   Cranial Nerves: No cranial nerve deficit.  ?   Sensory: No sensory deficit.  ?   Motor: Motor function is intact.  ?Psychiatric:     ?   Attention and Perception: Attention normal.     ?   Mood and Affect: Affect is blunt and flat.     ?   Speech: Speech is delayed.     ?   Behavior: Behavior is withdrawn.  ? ? ?ED Results / Procedures / Treatments   ?Labs ?(all labs ordered are listed, but only abnormal results are displayed) ?Labs Reviewed  ?COMPREHENSIVE METABOLIC PANEL - Abnormal; Notable for the following components:  ?    Result Value  ? Glucose, Bld 100 (*)   ? All other components within normal limits  ?SALICYLATE LEVEL - Abnormal; Notable for  the following components:  ? Salicylate Lvl <7.7 (*)   ? All other components within normal limits  ?ACETAMINOPHEN LEVEL - Abnormal; Notable for the following components:  ? Acetaminophen (Tylenol), Serum <10 (*)   ? All other components within normal limits  ?CBC - Abnormal; Notable for the following components:  ? Hemoglobin 15.9 (*)   ? All other components within normal limits  ?ETHANOL  ?RAPID URINE DRUG SCREEN, HOSP PERFORMED  ? ? ?EKG ?None ? ?Radiology ?No results found. ? ?Procedures ?Procedures  ? ? ?Medications Ordered in ED ?Medications - No data to display ? ?ED Course/ Medical Decision Making/ A&P ?  ?                        ?Medical Decision Making ?Amount and/or Complexity of Data Reviewed ?Labs: ordered. ? ? ?Patient's labs here show no acute findings.  No evidence of dehydration.  Patient had a meal to eat here.  She is not responding to internal stimuli.  She has no active SI or HI.  She is here with her mother who has spoken with at length about her current condition.  Patient requires outpatient therapy for management of her chronic anxiety.  Will refer to the behavioral health urgent care center ? ? ? ? ? ? ? ?Final Clinical Impression(s) / ED Diagnoses ?Final diagnoses:  ?None  ? ? ?Rx / DC Orders ?ED Discharge Orders   ? ? None  ? ?  ? ? ?  ?Lacretia Leigh, MD ?07/05/21 1126 ? ?

## 2021-07-05 NOTE — ED Triage Notes (Signed)
Pt presents to [facility] accompanied by her mother. Pt states that she has been feeling symptoms of depression and anxiety. Pts mother states that the pt has been having trouble sleeping at night and she has been experiencing memory issues. Pts mother states that the pt was seen at an urgent care 2 weeks ago and was prescribed medication for sleep and anxiety but she cannot recall the medication. Pts mother states that the symptoms have gotten worse since the pt started taking the medication and would like to get medication management. Pt denies SI/HI and AVH.  ?

## 2021-07-05 NOTE — Discharge Instructions (Addendum)
Go to the [facility] behavioral urgent care center today to establish care for your psychiatric condition ?

## 2021-07-05 NOTE — Discharge Instructions (Addendum)

## 2021-07-05 NOTE — ED Triage Notes (Addendum)
Patient's mother reports that for the past 2 months that the patient has been having panic attacks and feeling depressed. Mother reports that the patient's youngest son moved out and the oldest son is getting married and will be moving out. Patient is currently living with her mother. Paatient's mother added that the patient is constantly pacing at home. ? ?Patient will not answer when asked if she was suicidal. Patient's mother states that the patient has never mentioned this at home. ?Patient's mother denies alcohol or drug use. ? ? ?Patient's mother reports that she took the patient to an UC 2 weeks ago where she was prescribed Hydroxyzine. Patient's mother has been giving the patient ZZZquil for sleep. ?
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
## 2022-01-01 ENCOUNTER — Other Ambulatory Visit: Payer: Self-pay | Admitting: Obstetrics and Gynecology

## 2022-01-01 DIAGNOSIS — Z1231 Encounter for screening mammogram for malignant neoplasm of breast: Secondary | ICD-10-CM

## 2022-01-02 ENCOUNTER — Encounter: Payer: Self-pay | Admitting: *Deleted

## 2022-01-31 ENCOUNTER — Ambulatory Visit: Payer: Self-pay

## 2022-02-14 ENCOUNTER — Ambulatory Visit: Payer: Medicaid Other

## 2022-02-14 ENCOUNTER — Ambulatory Visit: Payer: No Typology Code available for payment source

## 2022-03-19 ENCOUNTER — Other Ambulatory Visit: Payer: Self-pay | Admitting: Family Medicine

## 2022-03-19 DIAGNOSIS — Z1231 Encounter for screening mammogram for malignant neoplasm of breast: Secondary | ICD-10-CM

## 2022-04-12 ENCOUNTER — Telehealth: Payer: Self-pay | Admitting: *Deleted

## 2022-04-12 ENCOUNTER — Ambulatory Visit (AMBULATORY_SURGERY_CENTER): Payer: Medicaid Other | Admitting: *Deleted

## 2022-04-12 VITALS — Ht 62.0 in | Wt 97.0 lb

## 2022-04-12 DIAGNOSIS — Z1211 Encounter for screening for malignant neoplasm of colon: Secondary | ICD-10-CM

## 2022-04-12 MED ORDER — PLENVU 140 G PO SOLR: 1.0000 | ORAL | 0 refills | Status: DC

## 2022-04-12 NOTE — Telephone Encounter (Unsigned)
LM with call back # and request for call back.

## 2022-04-12 NOTE — Telephone Encounter (Signed)
Busy signal. Will try other # listed

## 2022-04-12 NOTE — Progress Notes (Signed)
Permission to speak to Mother given by patient after verifying pt. Phone on speaker so pt can hear, No egg or soy allergy known to patient  No issues known to pt with past sedation with any surgeries or procedures Patient denies ever being told they had issues or difficulty with intubation  No FH of Malignant Hyperthermia Pt is not on diet pills Pt is not on  home 02  Pt is not on blood thinners  Pt denies issues with constipation  Pt is not on dialysis Pt denies any upcoming cardiac testing Pt encouraged to use to use Singlecare or Goodrx to reduce cost  Patient's chart reviewed by Osvaldo Angst CNRA prior to previsit and patient appropriate for the [place].  Previsit completed and red dot placed by patient's name on their procedure day (on provider's schedule).  . Instructions reviewed with pt and pt's Mother and pt states understanding. Instructed to review again prior to procedure. Pt states they will.  Instructions sent by mail and my chart

## 2022-05-06 ENCOUNTER — Other Ambulatory Visit: Payer: Self-pay | Admitting: Gastroenterology

## 2022-05-06 DIAGNOSIS — Z1211 Encounter for screening for malignant neoplasm of colon: Secondary | ICD-10-CM

## 2022-05-07 MED ORDER — PLENVU 140 G PO SOLR: 1.0000 | ORAL | 0 refills | Status: DC

## 2022-05-07 NOTE — Telephone Encounter (Signed)
Received notification that [facility] of patients choice is unable to get plenvu prep in.  Called patient and left message that the same [facility] was being [facility] to [facility] in the same area for her to pick up.  Left call back number for patient if she is unable to get [facility] then we would need to change prep RX and update instructions.  Wasn't comfortable making these changes without speaking to the patient first.

## 2022-05-08 ENCOUNTER — Ambulatory Visit
Admission: RE | Admit: 2022-05-08 | Discharge: 2022-05-08 | Disposition: A | Discharge status: Home / Self Care | Payer: Medicaid Other | Source: Ambulatory Visit | Attending: Family Medicine | Admitting: Family Medicine

## 2022-05-08 DIAGNOSIS — Z1231 Encounter for screening mammogram for malignant neoplasm of breast: Secondary | ICD-10-CM

## 2022-05-08 NOTE — Telephone Encounter (Signed)
Spoke with pt who gave permission to speak to her Mother. Mother stated that they do have the RX at the pharmacy and they will be picking it up tomorrow. Cost is $4.00 per Mother.

## 2022-05-13 ENCOUNTER — Encounter: Payer: Self-pay | Admitting: Gastroenterology

## 2022-05-13 ENCOUNTER — Ambulatory Visit (AMBULATORY_SURGERY_CENTER): Payer: Medicaid Other | Admitting: Gastroenterology

## 2022-05-13 VITALS — BP 127/65 | HR 90 | Temp 98.0°F | Resp 17 | Ht 62.0 in | Wt 97.0 lb

## 2022-05-13 DIAGNOSIS — K5730 Diverticulosis of large intestine without perforation or abscess without bleeding: Secondary | ICD-10-CM

## 2022-05-13 DIAGNOSIS — Z1211 Encounter for screening for malignant neoplasm of colon: Secondary | ICD-10-CM | POA: Diagnosis not present

## 2022-05-13 DIAGNOSIS — D128 Benign neoplasm of rectum: Secondary | ICD-10-CM | POA: Diagnosis not present

## 2022-05-13 MED ORDER — SODIUM CHLORIDE 0.9 % IV SOLN: 500.0000 mL | Freq: Once | INTRAVENOUS | Status: DC

## 2022-05-13 NOTE — Progress Notes (Signed)
Sedate, gd SR, tolerated procedure well, VSS, report to RN 

## 2022-05-13 NOTE — Patient Instructions (Signed)
Resume previous diet and medications. Awaiting pathology results. Repeat Colonoscopy date to be determined based on pathology.  YOU HAD AN ENDOSCOPIC PROCEDURE TODAY AT [facility]:   Refer to the procedure report that was given to you for any specific questions about what was found during the examination.  If the procedure report does not answer your questions, please call your gastroenterologist to clarify.  If you requested that your care partner not be given the details of your procedure findings, then the procedure report has been included in a sealed envelope for you to review at your convenience later.  YOU SHOULD EXPECT: Some feelings of bloating in the abdomen. Passage of more gas than usual.  Walking can help get rid of the air that was put into your GI tract during the procedure and reduce the bloating. If you had a lower endoscopy (such as a colonoscopy or flexible sigmoidoscopy) you may notice spotting of blood in your stool or on the toilet paper. If you underwent a bowel prep for your procedure, you may not have a normal bowel movement for a few days.  Please Note:  You might notice some irritation and congestion in your nose or some drainage.  This is from the oxygen used during your procedure.  There is no need for concern and it should clear up in a day or so.  SYMPTOMS TO REPORT IMMEDIATELY:  Following lower endoscopy (colonoscopy or flexible sigmoidoscopy):  Excessive amounts of blood in the stool  Significant tenderness or worsening of abdominal pains  Swelling of the abdomen that is new, acute  Fever of 100F or higher  For urgent or emergent issues, a gastroenterologist can be reached at any hour by calling [phone]. Do not use MyChart messaging for urgent concerns.    DIET:  We do recommend a small meal at first, but then you may proceed to your regular diet.  Drink plenty of fluids but you should avoid alcoholic beverages for 24 hours.  ACTIVITY:   You should plan to take it easy for the rest of today and you should NOT DRIVE or use heavy machinery until tomorrow (because of the sedation medicines used during the test).    FOLLOW UP: Our staff will call the number listed on your records the next business day following your procedure.  We will call around 7:15- 8:00 am to check on you and address any questions or concerns that you may have regarding the information given to you following your procedure. If we do not reach you, we will leave a message.     If any biopsies were taken you will be contacted by phone or by letter within the next 1-3 weeks.  Please call us at [phone] if you have not heard about the biopsies in 3 weeks.    SIGNATURES/CONFIDENTIALITY: You and/or your care partner have signed paperwork which will be entered into your electronic medical record.  These signatures attest to the fact that that the information above on your After Visit Summary has been reviewed and is understood.  Full responsibility of the confidentiality of this discharge information lies with you and/or your care-partner.

## 2022-05-13 NOTE — Progress Notes (Signed)
Pt's states no medical or surgical changes since previsit or office visit. 

## 2022-05-13 NOTE — Progress Notes (Signed)
Called to room to assist during endoscopic procedure.  Patient ID and intended procedure confirmed with present staff. Received instructions for my participation in the procedure from the performing physician.  

## 2022-05-13 NOTE — Op Note (Signed)
[place] Patient Name: Linda Pugh Procedure Date: 05/13/2022 11:02 AM MRN: [id number] Endoscopist: Gerrit Heck , MD, [id number] Age: 52 Referring MD:  Date of Birth: 1970-05-19 Gender: Female Account #: [id number] Procedure:                Colonoscopy Indications:              Screening for colorectal malignant neoplasm, This                            is the patient's first colonoscopy Medicines:                Monitored Anesthesia Care Procedure:                Pre-Anesthesia Assessment:                           - Prior to the procedure, a History and Physical                            was performed, and patient medications and                            allergies were reviewed. The patient's tolerance of                            previous anesthesia was also reviewed. The risks                            and benefits of the procedure and the sedation                            options and risks were discussed with the patient.                            All questions were answered, and informed consent                            was obtained. Prior Anticoagulants: The patient has                            taken no anticoagulant or antiplatelet agents. ASA                            Grade Assessment: II - A patient with mild systemic                            disease. After reviewing the risks and benefits,                            the patient was deemed in satisfactory condition to                            undergo the procedure.  After obtaining informed consent, the colonoscope                            was passed under direct vision. Throughout the                            procedure, the patient's blood pressure, pulse, and                            oxygen saturations were monitored continuously. The                            Olympus SN [id number] was introduced through the anus                            and advanced to the  the terminal ileum. The                            colonoscopy was performed without difficulty. The                            patient tolerated the procedure well. The quality                            of the bowel preparation was good. The terminal                            ileum, ileocecal valve, appendiceal orifice, and                            rectum were photographed. Scope In: 11:25:46 AM Scope Out: 11:40:23 AM Scope Withdrawal Time: 0 hours 10 minutes 34 seconds  Total Procedure Duration: 0 hours 14 minutes 37 seconds  Findings:                 The perianal and digital rectal examinations were                            normal.                           A 6 mm polyp was found in the rectum. The polyp was                            sessile. The polyp was removed with a cold snare.                            Resection and retrieval were complete. Estimated                            blood loss was minimal.                           Multiple small-mouthed diverticula were found in  the sigmoid colon.                           The retroflexed view of the distal rectum and anal                            verge was normal and showed no anal or rectal                            abnormalities.                           The terminal ileum appeared normal. Complications:            No immediate complications. Estimated Blood Loss:     Estimated blood loss was minimal. Impression:               - One 6 mm polyp in the rectum, removed with a cold                            snare. Resected and retrieved.                           - Diverticulosis in the sigmoid colon.                           - The distal rectum and anal verge are normal on                            retroflexion view.                           - The examined portion of the ileum was normal. Recommendation:           - Patient has a contact number available for                             emergencies. The signs and symptoms of potential                            delayed complications were discussed with the                            patient. Return to normal activities tomorrow.                            Written discharge instructions were provided to the                            patient.                           - Resume previous diet.                           - Continue present medications.                           -  Await pathology results.                           - Repeat colonoscopy for surveillance based on                            pathology results.                           - Return to GI office PRN. Gerrit Heck, MD 05/13/2022 11:45:34 AM

## 2022-05-13 NOTE — Progress Notes (Signed)
GASTROENTEROLOGY PROCEDURE H&P NOTE   Primary Care Physician: Raiford Simmonds., PA-C    Reason for Procedure:  Colon Cancer screening  Plan:    Colonoscopy  Patient is appropriate for endoscopic procedure(s) in the ambulatory ([place]) setting.  The nature of the procedure, as well as the risks, benefits, and alternatives were carefully and thoroughly reviewed with the patient. Ample time for discussion and questions allowed. The patient understood, was satisfied, and agreed to proceed.     HPI: Linda Pugh is a 52 y.o. female who presents for colonoscopy for routine Colon Cancer screening.  No active GI symptoms.  Mother with colon polyps, but o/w no known family history of colon cancer or related malignancy.  Patient is otherwise without complaints or active issues today.  Past Medical History:  Diagnosis Date   Anxiety    Arthritis    arthritis right hip    Depression    GERD (gastroesophageal reflux disease)    Hyperlipidemia    Migraines    Trichomonal vaginitis 03/13/2018   By pap smear 02/2018    Past Surgical History:  Procedure Laterality Date   CLEFT PALATE REPAIR     HIP ARTHROSCOPY  03/20/2011   Procedure: ARTHROSCOPY HIP;  Surgeon: Dione Plover Aluisio;  Location: [facility];  Service: Orthopedics;  Laterality: Right;  hip arthtroscopy right with labral debridment and chondroplasty   TUBAL LIGATION     TYMPANIC MEMBRANE REPAIR      Prior to Admission medications   Medication Sig Start Date End Date Taking? Authorizing Provider  amantadine (SYMMETREL) 100 MG capsule Take 100 mg by mouth 2 (two) times daily.   Yes [provider]  clonazePAM (KLONOPIN) 0.5 MG tablet Take by mouth. 01/30/22  Yes [provider]  OLANZapine (ZYPREXA) 10 MG tablet Take 10 mg by mouth at bedtime.   Yes [provider]  omeprazole (PRILOSEC) 10 MG capsule Take 10 mg by mouth daily.   Yes [provider]  traZODone (DESYREL) 50 MG tablet Take 1 tablet  (50 mg total) by mouth at bedtime. 07/05/21  Yes Lucky Rathke, FNP  ALPRAZolam Duanne Moron) 0.5 MG tablet Take 0.5 tablets (0.25 mg total) by mouth daily as needed for anxiety. 04/03/18   Leamon Arnt, MD  Ascorbic Acid (VITAMIN C) 1000 MG tablet Take 1,000 mg by mouth daily. Patient not taking: Reported on 04/12/2022    [provider]  azelastine (ASTELIN) 0.1 % nasal spray Place into both nostrils 2 (two) times daily. Use in each nostril as directed Patient not taking: Reported on 04/12/2022    [provider]  escitalopram (LEXAPRO) 5 MG tablet TAKE 1 TABLET BY MOUTH EVERY DAY Patient not taking: Reported on 04/12/2022 06/29/18   Leamon Arnt, MD  Loratadine 10 MG CAPS Take by mouth. Patient not taking: Reported on 04/12/2022    [provider]  omeprazole (PRILOSEC) 20 MG capsule TAKE 1 CAPSULE BY MOUTH EVERY DAY Patient not taking: Reported on 04/12/2022 07/27/18   Leamon Arnt, MD    Current Outpatient Medications  Medication Sig Dispense Refill   amantadine (SYMMETREL) 100 MG capsule Take 100 mg by mouth 2 (two) times daily.     clonazePAM (KLONOPIN) 0.5 MG tablet Take by mouth.     OLANZapine (ZYPREXA) 10 MG tablet Take 10 mg by mouth at bedtime.     omeprazole (PRILOSEC) 10 MG capsule Take 10 mg by mouth daily.     traZODone (DESYREL) 50 MG  tablet Take 1 tablet (50 mg total) by mouth at bedtime. 30 tablet 0   ALPRAZolam (XANAX) 0.5 MG tablet Take 0.5 tablets (0.25 mg total) by mouth daily as needed for anxiety. 10 tablet 0   Ascorbic Acid (VITAMIN C) 1000 MG tablet Take 1,000 mg by mouth daily. (Patient not taking: Reported on 04/12/2022)     azelastine (ASTELIN) 0.1 % nasal spray Place into both nostrils 2 (two) times daily. Use in each nostril as directed (Patient not taking: Reported on 04/12/2022)     escitalopram (LEXAPRO) 5 MG tablet TAKE 1 TABLET BY MOUTH EVERY DAY (Patient not taking: Reported on 04/12/2022) 90 tablet 3   Loratadine 10 MG CAPS Take by  mouth. (Patient not taking: Reported on 04/12/2022)     omeprazole (PRILOSEC) 20 MG capsule TAKE 1 CAPSULE BY MOUTH EVERY DAY (Patient not taking: Reported on 04/12/2022) 30 capsule 3   Current Facility-Administered Medications  Medication Dose Route Frequency Provider Last Rate Last Admin   0.9 %  sodium chloride infusion  500 mL Intravenous Once Maiana Hennigan V, DO        Allergies as of 05/13/2022 - Review Complete 05/13/2022  Allergen Reaction Noted   Sulfa antibiotics Hives 03/05/2011    Family History  Adopted: Yes  Problem Relation Age of Onset   Colon polyps Mother    Hypertension Son    Healthy Son    Colon cancer Neg Hx    Esophageal cancer Neg Hx    Rectal cancer Neg Hx    Stomach cancer Neg Hx     Social History   Socioeconomic History   Marital status: Divorced    Spouse name: Not on file   Number of children: 2   Years of education: Not on file   Highest education level: Not on file  Occupational History    Employer: [employer]  Tobacco Use   Smoking status: Every Day    Packs/day: 0.50    Years: 23.00    Total pack years: 11.50    Types: Cigarettes   Smokeless tobacco: Never   Tobacco comments:    1-2 cigs daily as of 07/2018  Vaping Use   Vaping Use: Never used  Substance and Sexual Activity   Alcohol use: No   Drug use: No   Sexual activity: Not Currently    Birth control/protection: Post-menopausal  Other Topics Concern   Not on file  Social History Narrative   Not on file   Social Determinants of Health   Financial Resource Strain: Not on file  Food Insecurity: Not on file  Transportation Needs: Not on file  Physical Activity: Not on file  Stress: Not on file  Social Connections: Not on file  Intimate Partner Violence: Not on file    Physical Exam: Vital signs in last 24 hours: '@BP'$  128/74   Pulse (!) 103   Temp 98 F (36.7 C)   Ht '5\' 2"'$  (1.575 m)   Wt 97 lb (44 kg)   LMP 10/12/2012   SpO2 100%   BMI 17.74  kg/m  GEN: NAD EYE: Sclerae anicteric ENT: MMM CV: Non-tachycardic Pulm: CTA b/l GI: Soft, NT/ND NEURO:  Alert & Oriented x 3   Gerrit Heck, DO [facility]   05/13/2022 11:07 AM

## 2022-05-14 ENCOUNTER — Telehealth: Payer: Self-pay | Admitting: *Deleted

## 2022-05-14 NOTE — Telephone Encounter (Signed)
  Follow up Call-     05/13/2022   10:43 AM  Call back number  Post procedure Call Back phone  # [phone]  Permission to leave phone message Yes     Patient questions:  Do you have a fever, pain , or abdominal swelling? No. Pain Score  0 *  Have you tolerated food without any problems? Yes.    Have you been able to return to your normal activities? Yes.    Do you have any questions about your discharge instructions: Diet   No. Medications  No. Follow up visit  No.  Do you have questions or concerns about your Care? No.  Actions: * If pain score is 4 or above: No action needed, pain <4.

## 2022-05-20 ENCOUNTER — Encounter: Payer: Self-pay | Admitting: Gastroenterology

## 2022-06-17 ENCOUNTER — Encounter: Payer: Self-pay | Admitting: *Deleted
